# Patient Record
Sex: Female | Born: 1955
Health system: Southern US, Community
[De-identification: ages and names within clinical notes are randomized; demographics above are authoritative.]

## PROBLEM LIST (undated history)

## (undated) DIAGNOSIS — E119 Type 2 diabetes mellitus without complications: Secondary | ICD-10-CM

## (undated) DIAGNOSIS — B019 Varicella without complication: Secondary | ICD-10-CM

## (undated) DIAGNOSIS — I10 Essential (primary) hypertension: Secondary | ICD-10-CM

## (undated) HISTORY — DX: Varicella without complication: B01.9

## (undated) HISTORY — DX: Type 2 diabetes mellitus without complications: E11.9

## (undated) HISTORY — PX: DILATION AND CURETTAGE OF UTERUS: SHX78

---

## 2000-12-20 ENCOUNTER — Other Ambulatory Visit: Admission: RE | Admit: 2000-12-20 | Discharge: 2000-12-20 | Payer: Self-pay | Admitting: Obstetrics and Gynecology

## 2008-02-22 ENCOUNTER — Other Ambulatory Visit: Admission: RE | Admit: 2008-02-22 | Discharge: 2008-02-22 | Payer: Self-pay | Admitting: Obstetrics and Gynecology

## 2009-02-12 ENCOUNTER — Emergency Department (HOSPITAL_COMMUNITY): Admission: EM | Admit: 2009-02-12 | Discharge: 2009-02-13 | Payer: Self-pay | Admitting: Emergency Medicine

## 2009-03-25 ENCOUNTER — Other Ambulatory Visit: Admission: RE | Admit: 2009-03-25 | Discharge: 2009-03-25 | Payer: Self-pay | Admitting: Obstetrics and Gynecology

## 2009-04-15 ENCOUNTER — Encounter: Payer: Self-pay | Admitting: Obstetrics and Gynecology

## 2009-04-15 ENCOUNTER — Ambulatory Visit (HOSPITAL_COMMUNITY): Admission: RE | Admit: 2009-04-15 | Discharge: 2009-04-15 | Payer: Self-pay | Admitting: Obstetrics and Gynecology

## 2010-11-28 LAB — COMPREHENSIVE METABOLIC PANEL
ALT: 23 U/L (ref 0–35)
AST: 25 U/L (ref 0–37)
Albumin: 4 g/dL (ref 3.5–5.2)
Alkaline Phosphatase: 80 U/L (ref 39–117)
CO2: 27 mEq/L (ref 19–32)
Chloride: 105 mEq/L (ref 96–112)
Creatinine, Ser: 0.83 mg/dL (ref 0.4–1.2)
GFR calc Af Amer: >60 mL/min (ref >60)
GFR calc non Af Amer: >60 mL/min (ref >60)
Potassium: 4 mEq/L (ref 3.5–5.1)
Sodium: 140 mEq/L (ref 135–145)
Total Bilirubin: 0.7 mg/dL (ref 0.3–1.2)

## 2010-11-28 LAB — CBC
MCV: 87.8 fL (ref 78.0–100.0)
Platelets: 244 10*3/uL (ref 150–400)
RBC: 4.85 MIL/uL (ref 3.87–5.11)
WBC: 5.3 10*3/uL (ref 4.0–10.5)

## 2011-01-05 NOTE — Op Note (Signed)
NAMEDEVINE, Donna                ACCOUNT NO.:  [id number]   MEDICAL RECORD NO.:  [id number]          PATIENT TYPE:  AMB   LOCATION:  DAY                           FACILITY:  [facility]   PHYSICIAN:  Tilda Burrow, M.D. DATE OF BIRTH:  09/14/1955   DATE OF PROCEDURE:  04/15/2009  DATE OF DISCHARGE:  04/15/2009                               OPERATIVE REPORT   PREOPERATIVE DIAGNOSES:  Postmenopausal bleeding and suspected  endometrial polyp.   POSTOPERATIVE DIAGNOSES:  Postmenopausal bleeding and suspected  endometrial polyp confirmed.   PROCEDURE:  Hysteroscopy, excision of endometrial polyp, dilation and  curettage.   SURGEON:  Tilda Burrow, MD   ASSISTANT:  None.   ANESTHESIA:  General, Idacavage CRNA   COMPLICATIONS:  None.   FINDINGS:  Uterus sounds to 8 cm, small 1-cm polyp originating from the  left cornual area of endometrial cavity.   DETAILS OF PROCEDURE:  The patient was taken to the operating room,  prepped and draped for vaginal procedure.  Time-out conducted.  Cervix  was grasped with single-tooth tenaculum and uterus sounded to 7 cm, with  dilation of the endometrium, dilation of the endocervix to 25-French  allowing introduction of a rigid 30-degree hysteroscope without  difficulty, identifying a small 1-cm long, flat polyp originating from  the thin stalk in the left cornual area.  No other significant portion  of the tissue were identified.  Hysteroscopic instruments were used to  transect across the lower stalk.  This was quite firm and fibrotic and  needed to be trimmed in segments.  The bulk of the polyp was able to be  removed in 2-3 large portions, total size was approximately 1 cm x 1 cm  x 5 mm.  The stump was then trimmed down as close as possible to the  endometrial surface and smooth sharp curettage then performed obtaining  scanty amount of additional tissue.  Specimen was sent for histology.  The patient was allowed to go to recovery room in  good condition after  removal of all hysteroscopic instruments.      Tilda Burrow, M.D.  Electronically Signed     JVF/MEDQ  D:  04/15/2009  T:  04/16/2009  Job:  [id number]
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
## 2015-06-18 ENCOUNTER — Emergency Department (HOSPITAL_COMMUNITY)
Admission: EM | Admit: 2015-06-18 | Discharge: 2015-06-18 | Discharge status: Absent without leave | Payer: BLUE CROSS/BLUE SHIELD

## 2015-06-18 ENCOUNTER — Emergency Department (HOSPITAL_COMMUNITY): Payer: BLUE CROSS/BLUE SHIELD

## 2015-06-18 ENCOUNTER — Observation Stay (HOSPITAL_COMMUNITY)
Admission: EM | Admit: 2015-06-18 | Discharge: 2015-06-20 | Disposition: A | Discharge status: Home / Self Care | Payer: BLUE CROSS/BLUE SHIELD | Attending: General Surgery | Admitting: General Surgery

## 2015-06-18 ENCOUNTER — Encounter (HOSPITAL_COMMUNITY): Payer: Self-pay | Admitting: Emergency Medicine

## 2015-06-18 DIAGNOSIS — E669 Obesity, unspecified: Secondary | ICD-10-CM | POA: Insufficient documentation

## 2015-06-18 DIAGNOSIS — Z6838 Body mass index (BMI) 38.0-38.9, adult: Secondary | ICD-10-CM | POA: Diagnosis not present

## 2015-06-18 DIAGNOSIS — Z87891 Personal history of nicotine dependence: Secondary | ICD-10-CM | POA: Insufficient documentation

## 2015-06-18 DIAGNOSIS — K353 Acute appendicitis with localized peritonitis, without perforation or gangrene: Secondary | ICD-10-CM

## 2015-06-18 DIAGNOSIS — K3580 Unspecified acute appendicitis: Secondary | ICD-10-CM | POA: Diagnosis present

## 2015-06-18 DIAGNOSIS — R109 Unspecified abdominal pain: Secondary | ICD-10-CM | POA: Diagnosis present

## 2015-06-18 DIAGNOSIS — I10 Essential (primary) hypertension: Secondary | ICD-10-CM | POA: Insufficient documentation

## 2015-06-18 HISTORY — DX: Essential (primary) hypertension: I10

## 2015-06-18 LAB — COMPREHENSIVE METABOLIC PANEL
ALBUMIN: 4.3 g/dL (ref 3.5–5.0)
ALT: 29 U/L (ref 14–54)
AST: 27 U/L (ref 15–41)
Alkaline Phosphatase: 85 U/L (ref 38–126)
Anion gap: 9 (ref 5–15)
BUN: 12 mg/dL (ref 6–20)
CHLORIDE: 103 mmol/L (ref 101–111)
CO2: 27 mmol/L (ref 22–32)
CREATININE: 0.73 mg/dL (ref 0.44–1.00)
Calcium: 9.3 mg/dL (ref 8.9–10.3)
GFR calc Af Amer: >60 mL/min (ref >60)
GLUCOSE: 143 mg/dL — AB (ref 65–99)
POTASSIUM: 3.9 mmol/L (ref 3.5–5.1)
Sodium: 139 mmol/L (ref 135–145)
Total Bilirubin: 0.9 mg/dL (ref 0.3–1.2)
Total Protein: 7.6 g/dL (ref 6.5–8.1)

## 2015-06-18 LAB — URINALYSIS, ROUTINE W REFLEX MICROSCOPIC
BILIRUBIN URINE: NEGATIVE
GLUCOSE, UA: NEGATIVE mg/dL
HGB URINE DIPSTICK: NEGATIVE
KETONES UR: 15 mg/dL — AB
Leukocytes, UA: NEGATIVE
NITRITE: NEGATIVE
PH: 5.5 (ref 5.0–8.0)
Protein, ur: NEGATIVE mg/dL
SPECIFIC GRAVITY, URINE: 1.02 (ref 1.005–1.030)
Urobilinogen, UA: 0.2 mg/dL (ref 0.0–1.0)

## 2015-06-18 LAB — CBC
HEMATOCRIT: 43.2 % (ref 36.0–46.0)
Hemoglobin: 15.1 g/dL — ABNORMAL HIGH (ref 12.0–15.0)
MCH: 30.8 pg (ref 26.0–34.0)
MCHC: 35 g/dL (ref 30.0–36.0)
MCV: 88 fL (ref 78.0–100.0)
PLATELETS: 263 10*3/uL (ref 150–400)
RBC: 4.91 MIL/uL (ref 3.87–5.11)
RDW: 12.3 % (ref 11.5–15.5)
WBC: 15.7 10*3/uL — AB (ref 4.0–10.5)

## 2015-06-18 LAB — I-STAT TROPONIN, ED: Troponin i, poc: 0 ng/mL (ref 0.00–0.08)

## 2015-06-18 LAB — LIPASE, BLOOD: LIPASE: 22 U/L (ref 11–51)

## 2015-06-18 MED ORDER — ONDANSETRON HCL 4 MG/2ML IJ SOLN
4.0000 mg | Freq: Four times a day (QID) | INTRAMUSCULAR | Status: DC | PRN
  Administered 2015-06-19: 4 mg via INTRAVENOUS
  Filled 2015-06-18: qty 2

## 2015-06-18 MED ORDER — IOHEXOL 300 MG/ML  SOLN
25.0000 mL | Freq: Once | INTRAMUSCULAR | Status: AC | PRN
  Administered 2015-06-18: 25 mL via ORAL

## 2015-06-18 MED ORDER — METRONIDAZOLE IN NACL 5-0.79 MG/ML-% IV SOLN
500.0000 mg | Freq: Three times a day (TID) | INTRAVENOUS | Status: DC
  Administered 2015-06-19 – 2015-06-20 (×5): 500 mg via INTRAVENOUS
  Filled 2015-06-18 (×6): qty 100

## 2015-06-18 MED ORDER — IOHEXOL 300 MG/ML  SOLN
100.0000 mL | Freq: Once | INTRAMUSCULAR | Status: AC | PRN
  Administered 2015-06-18: 100 mL via INTRAVENOUS

## 2015-06-18 MED ORDER — DEXTROSE 5 % IV SOLN
2.0000 g | INTRAVENOUS | Status: DC
  Administered 2015-06-19 – 2015-06-20 (×2): 2 g via INTRAVENOUS
  Filled 2015-06-18 (×2): qty 2

## 2015-06-18 MED ORDER — HYDROMORPHONE HCL 1 MG/ML IJ SOLN
1.0000 mg | INTRAMUSCULAR | Status: DC | PRN
  Administered 2015-06-19: 1 mg via INTRAVENOUS
  Filled 2015-06-18 (×2): qty 1

## 2015-06-18 MED ORDER — ONDANSETRON 4 MG PO TBDP: 4.0000 mg | ORAL_TABLET | Freq: Four times a day (QID) | ORAL | Status: DC | PRN

## 2015-06-18 MED ORDER — HYDROMORPHONE HCL 1 MG/ML IJ SOLN
1.0000 mg | Freq: Once | INTRAMUSCULAR | Status: AC
Start: 2015-06-18 — End: 2015-06-18
  Administered 2015-06-18: 1 mg via INTRAVENOUS
  Filled 2015-06-18: qty 1

## 2015-06-18 MED ORDER — ZOLPIDEM TARTRATE 5 MG PO TABS: 5.0000 mg | ORAL_TABLET | Freq: Every evening | ORAL | Status: DC | PRN

## 2015-06-18 MED ORDER — ENOXAPARIN SODIUM 40 MG/0.4ML ~~LOC~~ SOLN
40.0000 mg | Freq: Every day | SUBCUTANEOUS | Status: DC
  Administered 2015-06-19: 40 mg via SUBCUTANEOUS
  Filled 2015-06-18 (×2): qty 0.4

## 2015-06-18 MED ORDER — SODIUM CHLORIDE 0.9 % IV BOLUS (SEPSIS)
1000.0000 mL | Freq: Once | INTRAVENOUS | Status: AC
  Administered 2015-06-18: 1000 mL via INTRAVENOUS

## 2015-06-18 MED ORDER — KCL IN DEXTROSE-NACL 20-5-0.9 MEQ/L-%-% IV SOLN
INTRAVENOUS | Status: DC
  Administered 2015-06-19 (×3): via INTRAVENOUS
  Filled 2015-06-18 (×5): qty 1000

## 2015-06-18 NOTE — ED Provider Notes (Signed)
CSN: [id number]     Arrival date & time 06/18/15  1645 History   First MD Initiated Contact with Patient 06/18/15 1929     Chief Complaint  Patient presents with  . Abdominal Pain  . Nausea    (Consider location/radiation/quality/duration/timing/severity/associated sxs/prior Treatment) HPI   Donna Russell is a 59 yo Caucasian female with history of hypertension who presents to the Emergency Department for evaluation of abdominal pain that started this morning. She states pain started in the lower umbilical region of her abdomen and radiates to her chest and right lower quadrant. She describes pain as sharp and tight. She reports pain is intermittent. Endorses chills. Denies fever, shortness of breath, diaphoresis, dizziness, nausea/vomiting, hematuria, headache. Currently rates pain as 10/10. Pt states this has never happened to her before. Reports pain is worse with movement and improves with lying down.   Past Medical History  Diagnosis Date  . Hypertension    Past Surgical History  Procedure Laterality Date  . Dilation and curettage of uterus     History reviewed. No pertinent family history. Social History  Substance Use Topics  . Smoking status: None  . Smokeless tobacco: None  . Alcohol Use: None   OB History    No data available     Review of Systems  All other systems negative except as documented in the HPI. All pertinent positives and negatives as reviewed in the HPI.  Allergies  Review of patient's allergies indicates no known allergies.  Home Medications   Prior to Admission medications   Medication Sig Start Date End Date Taking? Authorizing Provider  Coenzyme Q10 (CO Q 10 PO) Take 1 tablet by mouth daily.   Yes Historical Provider, MD  lisinopril (PRINIVIL,ZESTRIL) 20 MG tablet Take 20 mg by mouth daily.   Yes Historical Provider, MD  Multiple Vitamin (MULTIVITAMIN WITH MINERALS) TABS tablet Take 1 tablet by mouth daily.   Yes Historical Provider, MD    BP 152/97 mmHg  Pulse 87  Temp(Src) 98.5 F (36.9 C) (Oral)  Resp 22  SpO2 98% Physical Exam  Constitutional: She is oriented to person, place, and time.  59 yo female lying in bed, appears to be in pain.   HENT:  Head: Normocephalic and atraumatic.  Mouth/Throat: Oropharynx is clear and moist.  Eyes: Pupils are equal, round, and reactive to light.  Neck: Normal range of motion. Neck supple.  Cardiovascular: Normal rate and regular rhythm.  Exam reveals no gallop and no friction rub.   No murmur heard. Pulmonary/Chest: Effort normal and breath sounds normal. No respiratory distress. She has no wheezes. She has no rales.  Abdominal: Soft. Bowel sounds are normal. She exhibits no distension and no mass. There is tenderness. There is guarding. There is no rebound.  Musculoskeletal: Normal range of motion.  No CVA tenderness.  Neurological: She is alert and oriented to person, place, and time.  Skin: Skin is warm. No rash noted. No erythema. No pallor.    ED Course  Procedures (including critical care time) Labs Review Labs Reviewed  COMPREHENSIVE METABOLIC PANEL - Abnormal; Notable for the following:    Glucose, Bld 143 (*)    All other components within normal limits  CBC - Abnormal; Notable for the following:    WBC 15.7 (*)    Hemoglobin 15.1 (*)    All other components within normal limits  URINALYSIS, ROUTINE W REFLEX MICROSCOPIC (NOT AT [facility]) - Abnormal; Notable for the following:    APPearance CLOUDY (*)  Ketones, ur 15 (*)    All other components within normal limits  LIPASE, BLOOD  I-STAT TROPOININ, ED    Imaging Review No results found. I have personally reviewed and evaluated these images and lab results as part of my medical decision-making.   EKG Interpretation   Date/Time:  Wednesday June 18 2015 17:11:05 EDT Ventricular Rate:  77 PR Interval:  147 QRS Duration: 105 QT Interval:  374 QTC Calculation: 423 R Axis:   8 Text Interpretation:   Sinus rhythm Low voltage, precordial leads RSR' in  V1 or V2, right VCD or RVH Baseline wander in lead(s) V3 ED PHYSICIAN  INTERPRETATION AVAILABLE IN [facility] Confirmed by TEST, Record  ([id number]) on 06/19/2015 9:27:35 AM      MDM   Final diagnoses:  None     10:50 pm - Pt states pain has subsided a little. However, CT reveals acute appendicitis.   Spoke with General Surgery about the patient and they will be down to evaluate her for surgery  Charlestine NightChristopher Aneya Daddona, PA-C 06/25/15 1608  Lavera Guiseana Duo Liu, MD 06/27/15 1215

## 2015-06-18 NOTE — Progress Notes (Signed)
Patient listed as having [employer] insurance without a pcp.  EDCM spoke to patient at bedside.  Patient reports her pcp is located at [facility].  Dr. Juluis RainierElizabeth Barnes.  System updated.

## 2015-06-18 NOTE — H&P (Signed)
Donna Russell is an 59 y.o. female.   Chief Complaint: Abdominal pain with diarrhea HPI: Patient presents to the emergency room with a one-day history of nausea vomiting diarrhea and right lower quadrant abdominal pain. Her symptoms started last night after dinner. She had diarrhea after eating last night. Today she developed more right lower quadrant pain which became constant and sharp. Made worse with movement. It has progressed throughout the day. Is a 7 out of 10. Location right lower quadrant abdomen. Sharp moves or coughs. CT scan showed acute appendicitis.  Past Medical History  Diagnosis Date  . Hypertension     Past Surgical History  Procedure Laterality Date  . Dilation and curettage of uterus      History reviewed. No pertinent family history. Social History:  has no tobacco, alcohol, and drug history on file.  Allergies: No Known Allergies   (Not in a hospital admission)  Results for orders placed or performed during the hospital encounter of 06/18/15 (from the past 48 hour(s))  Lipase, blood     Status: None   Collection Time: 06/18/15  5:41 PM  Result Value Ref Range   Lipase 22 11 - 51 U/L    Comment: Please note change in reference range.  Comprehensive metabolic panel     Status: Abnormal   Collection Time: 06/18/15  5:41 PM  Result Value Ref Range   Sodium 139 135 - 145 mmol/L   Potassium 3.9 3.5 - 5.1 mmol/L   Chloride 103 101 - 111 mmol/L   CO2 27 22 - 32 mmol/L   Glucose, Bld 143 (H) 65 - 99 mg/dL   BUN 12 6 - 20 mg/dL   Creatinine, Ser 0.73 0.44 - 1.00 mg/dL   Calcium 9.3 8.9 - 10.3 mg/dL   Total Protein 7.6 6.5 - 8.1 g/dL   Albumin 4.3 3.5 - 5.0 g/dL   AST 27 15 - 41 U/L   ALT 29 14 - 54 U/L   Alkaline Phosphatase 85 38 - 126 U/L   Total Bilirubin 0.9 0.3 - 1.2 mg/dL   GFR calc non Af Amer >60 >60 mL/min   GFR calc Af Amer >60 >60 mL/min    Comment: (NOTE) The eGFR has been calculated using the CKD EPI equation. This calculation has not been  validated in all clinical situations. eGFR's persistently <60 mL/min signify possible Chronic Kidney Disease.    Anion gap 9 5 - 15  CBC     Status: Abnormal   Collection Time: 06/18/15  5:41 PM  Result Value Ref Range   WBC 15.7 (H) 4.0 - 10.5 K/uL   RBC 4.91 3.87 - 5.11 MIL/uL   Hemoglobin 15.1 (H) 12.0 - 15.0 g/dL   HCT 43.2 36.0 - 46.0 %   MCV 88.0 78.0 - 100.0 fL   MCH 30.8 26.0 - 34.0 pg   MCHC 35.0 30.0 - 36.0 g/dL   RDW 12.3 11.5 - 15.5 %   Platelets 263 150 - 400 K/uL  I-stat troponin, ED     Status: None   Collection Time: 06/18/15  5:46 PM  Result Value Ref Range   Troponin i, poc 0.00 0.00 - 0.08 ng/mL   Comment 3            Comment: Due to the release kinetics of cTnI, a negative result within the first hours of the onset of symptoms does not rule out myocardial infarction with certainty. If myocardial infarction is still suspected, repeat the test at  appropriate intervals.   Urinalysis, Routine w reflex microscopic (not at [facility])     Status: Abnormal   Collection Time: 06/18/15  7:31 PM  Result Value Ref Range   Color, Urine YELLOW YELLOW   APPearance CLOUDY (A) CLEAR   Specific Gravity, Urine 1.020 1.005 - 1.030   pH 5.5 5.0 - 8.0   Glucose, UA NEGATIVE NEGATIVE mg/dL   Hgb urine dipstick NEGATIVE NEGATIVE   Bilirubin Urine NEGATIVE NEGATIVE   Ketones, ur 15 (A) NEGATIVE mg/dL   Protein, ur NEGATIVE NEGATIVE mg/dL   Urobilinogen, UA 0.2 0.0 - 1.0 mg/dL   Nitrite NEGATIVE NEGATIVE   Leukocytes, UA NEGATIVE NEGATIVE    Comment: MICROSCOPIC NOT DONE ON URINES WITH NEGATIVE PROTEIN, BLOOD, LEUKOCYTES, NITRITE, OR GLUCOSE <1000 mg/dL.   Ct Abdomen Pelvis W Contrast  06/18/2015  CLINICAL DATA:  Acute onset of diarrhea. Chills and nausea. Stomach ache. Right lower quadrant abdominal pain. Initial encounter. EXAM: CT ABDOMEN AND PELVIS WITH CONTRAST TECHNIQUE: Multidetector CT imaging of the abdomen and pelvis was performed using the standard protocol following  bolus administration of intravenous contrast. CONTRAST:  154m OMNIPAQUE IOHEXOL 300 MG/ML  SOLN COMPARISON:  None. FINDINGS: Minimal bibasilar atelectasis is noted. The liver and spleen are unremarkable in appearance. The gallbladder is within normal limits. The pancreas and adrenal glands are unremarkable. The kidneys are unremarkable in appearance. There is no evidence of hydronephrosis. No renal or ureteral stones are seen. No perinephric stranding is appreciated. No free fluid is identified. The small bowel is unremarkable in appearance. The stomach is within normal limits. No acute vascular abnormalities are seen. The appendix is dilated to 1.4 cm in maximal diameter, with surrounding soft tissue inflammation and trace fluid. Findings are compatible with acute appendicitis. There is no evidence of perforation or abscess formation. Scattered diverticulosis is noted along the proximal sigmoid colon, without evidence of diverticulitis. The bladder is mildly distended and grossly unremarkable. The uterus is unremarkable in appearance. The ovaries are relatively symmetric. No suspicious adnexal masses are seen. No inguinal lymphadenopathy is seen. No acute osseous abnormalities are identified. IMPRESSION: 1. Acute appendicitis, with dilatation of the appendix to 1.4 cm in maximal diameter, with surrounding soft tissue inflammation and trace fluid. No evidence of perforation or abscess formation. 2. Scattered diverticulosis along the proximal sigmoid colon, without evidence of diverticulitis. These results were called by telephone at the time of interpretation on 06/18/2015 at 10:47 pm to Dr. BEvelina Bucy who verbally acknowledged these results. Electronically Signed   By: JGarald BaldingM.D.   On: 06/18/2015 22:47    Review of Systems  Constitutional: Positive for fever and chills.  HENT: Negative.   Respiratory: Negative.   Cardiovascular: Negative.   Gastrointestinal: Positive for abdominal pain and  diarrhea.  Genitourinary: Negative.   Musculoskeletal: Negative.   Skin: Negative.     Blood pressure 139/69, pulse 97, temperature 98.5 F (36.9 C), temperature source Oral, resp. rate 14, SpO2 96 %. Physical Exam  Constitutional: She is oriented to person, place, and time. She appears well-developed and well-nourished.  HENT:  Head: Normocephalic and atraumatic.  Eyes: Pupils are equal, round, and reactive to light. No scleral icterus.  Neck: Normal range of motion.  Cardiovascular: Normal rate and regular rhythm.   Respiratory: Effort normal and breath sounds normal.  GI: Soft. There is tenderness in the right lower quadrant. There is rebound, guarding and tenderness at McBurney's point. There is no rigidity.  Musculoskeletal: Normal range of motion.  Neurological:  She is alert and oriented to person, place, and time.  Skin: Skin is warm and dry.  Psychiatric: She has a normal mood and affect. Her behavior is normal.     Assessment/Plan Acute appendicitis nonruptured  Admit for IV fluids, IV antibiotics and surgery in a.m. per Dr. the week. Nothing by mouth after midnight. Plan discussed with patient and family.  Cline Draheim A. 06/18/2015, 11:57 PM

## 2015-06-18 NOTE — ED Provider Notes (Signed)
Medical screening examination/treatment/procedure(s) were conducted as a shared visit with non-physician practitioner(s) and myself.  I personally evaluated the patient during the encounter.  59 year old female with history of hypertension and no prior abdominal surgeries who presents with abdominal pain and nausea. Reports onset of generalized abdominal pain this morning, that is now traveled to the right lower quadrant. With nausea and diarrhea, but no vomiting.  Afebrile here and hemodynamically stable. Abdomen is soft and non-peritoneal focal right lower quadrant tenderness to palpation. Leukocytosis present on blood work. CT abdomen pelvis visualized and showed acute uncomplicated appendicitis. General surgeries consulted. We will initiate antibiotics, IV fluids, and admit for ongoing management.  Lavera Guiseana Duo Ahniya Mitchum, MD 06/18/15 2329

## 2015-06-18 NOTE — ED Notes (Signed)
Pt from Urgent care today, referred here for possible appendicitis. Pt states she went out to eat last night and had an urgent episode of diarrhea. Went to sleep, had another soft BM this morning with a stomach ache, chills, and occasional nausea. States the stomach pain feels like a pressure in the center of her stomach with a sharp pain in the lower right quadrant. No vomiting/fever/SOB. States the pain occasionally radiates up into chest, and that the pain in her RLQ is so severe it makes it difficult to walk.

## 2015-06-19 ENCOUNTER — Observation Stay (HOSPITAL_COMMUNITY): Payer: BLUE CROSS/BLUE SHIELD | Admitting: Certified Registered Nurse Anesthetist

## 2015-06-19 ENCOUNTER — Encounter (HOSPITAL_COMMUNITY): Payer: Self-pay | Admitting: *Deleted

## 2015-06-19 ENCOUNTER — Encounter (HOSPITAL_COMMUNITY)
Admission: EM | Disposition: A | Discharge status: Home / Self Care | Payer: Self-pay | Source: Home / Self Care | Attending: Emergency Medicine

## 2015-06-19 HISTORY — PX: LAPAROSCOPIC APPENDECTOMY: SHX408

## 2015-06-19 LAB — URINE MICROSCOPIC-ADD ON

## 2015-06-19 LAB — CBC
HEMATOCRIT: 40.8 % (ref 36.0–46.0)
Hemoglobin: 13.6 g/dL (ref 12.0–15.0)
MCH: 29.7 pg (ref 26.0–34.0)
MCHC: 33.3 g/dL (ref 30.0–36.0)
MCV: 89.1 fL (ref 78.0–100.0)
PLATELETS: 203 10*3/uL (ref 150–400)
RBC: 4.58 MIL/uL (ref 3.87–5.11)
RDW: 12.8 % (ref 11.5–15.5)
WBC: 15 10*3/uL — ABNORMAL HIGH (ref 4.0–10.5)

## 2015-06-19 LAB — URINALYSIS, ROUTINE W REFLEX MICROSCOPIC
BILIRUBIN URINE: NEGATIVE
Glucose, UA: NEGATIVE mg/dL
HGB URINE DIPSTICK: NEGATIVE
Ketones, ur: NEGATIVE mg/dL
Nitrite: POSITIVE — AB
PH: 5 (ref 5.0–8.0)
Protein, ur: NEGATIVE mg/dL
SPECIFIC GRAVITY, URINE: 1.042 — AB (ref 1.005–1.030)
Urobilinogen, UA: 0.2 mg/dL (ref 0.0–1.0)

## 2015-06-19 LAB — GLUCOSE, CAPILLARY
GLUCOSE-CAPILLARY: 130 mg/dL — AB (ref 65–99)
Glucose-Capillary: 180 mg/dL — ABNORMAL HIGH (ref 65–99)

## 2015-06-19 SURGERY — APPENDECTOMY, LAPAROSCOPIC
Anesthesia: General

## 2015-06-19 MED ORDER — EPHEDRINE SULFATE 50 MG/ML IJ SOLN
INTRAMUSCULAR | Status: AC
  Filled 2015-06-19: qty 1

## 2015-06-19 MED ORDER — MIDAZOLAM HCL 5 MG/5ML IJ SOLN
INTRAMUSCULAR | Status: DC | PRN
  Administered 2015-06-19 (×2): 1 mg via INTRAVENOUS

## 2015-06-19 MED ORDER — GLYCOPYRROLATE 0.2 MG/ML IJ SOLN
INTRAMUSCULAR | Status: AC
  Filled 2015-06-19: qty 3

## 2015-06-19 MED ORDER — LIDOCAINE HCL (CARDIAC) 20 MG/ML IV SOLN
INTRAVENOUS | Status: DC | PRN
  Administered 2015-06-19: 100 mg via INTRAVENOUS

## 2015-06-19 MED ORDER — BUPIVACAINE-EPINEPHRINE (PF) 0.25% -1:200000 IJ SOLN
INTRAMUSCULAR | Status: AC
  Filled 2015-06-19: qty 30

## 2015-06-19 MED ORDER — GLYCOPYRROLATE 0.2 MG/ML IJ SOLN
INTRAMUSCULAR | Status: DC | PRN
  Administered 2015-06-19: 0.6 mg via INTRAVENOUS
  Administered 2015-06-19: 0.2 mg via INTRAVENOUS

## 2015-06-19 MED ORDER — LIDOCAINE HCL (CARDIAC) 20 MG/ML IV SOLN
INTRAVENOUS | Status: AC
  Filled 2015-06-19: qty 5

## 2015-06-19 MED ORDER — PHENYLEPHRINE 40 MCG/ML (10ML) SYRINGE FOR IV PUSH (FOR BLOOD PRESSURE SUPPORT)
PREFILLED_SYRINGE | INTRAVENOUS | Status: AC
  Filled 2015-06-19: qty 10

## 2015-06-19 MED ORDER — ENOXAPARIN SODIUM 40 MG/0.4ML ~~LOC~~ SOLN
40.0000 mg | Freq: Every day | SUBCUTANEOUS | Status: DC
  Filled 2015-06-19: qty 0.4

## 2015-06-19 MED ORDER — SUCCINYLCHOLINE CHLORIDE 20 MG/ML IJ SOLN
INTRAMUSCULAR | Status: DC | PRN
  Administered 2015-06-19: 100 mg via INTRAVENOUS

## 2015-06-19 MED ORDER — DEXAMETHASONE SODIUM PHOSPHATE 10 MG/ML IJ SOLN
INTRAMUSCULAR | Status: AC
  Filled 2015-06-19: qty 1

## 2015-06-19 MED ORDER — MIDAZOLAM HCL 2 MG/2ML IJ SOLN
INTRAMUSCULAR | Status: AC
  Filled 2015-06-19: qty 4

## 2015-06-19 MED ORDER — ONDANSETRON HCL 4 MG/2ML IJ SOLN
INTRAMUSCULAR | Status: DC | PRN
  Administered 2015-06-19: 4 mg via INTRAVENOUS

## 2015-06-19 MED ORDER — SODIUM CHLORIDE 0.9 % IV SOLN
10.0000 mg | INTRAVENOUS | Status: DC | PRN
  Administered 2015-06-19: 50 ug/min via INTRAVENOUS

## 2015-06-19 MED ORDER — PHENYLEPHRINE HCL 10 MG/ML IJ SOLN
INTRAMUSCULAR | Status: DC | PRN
  Administered 2015-06-19 (×5): 80 ug via INTRAVENOUS

## 2015-06-19 MED ORDER — FENTANYL CITRATE (PF) 100 MCG/2ML IJ SOLN
INTRAMUSCULAR | Status: DC | PRN
  Administered 2015-06-19 (×2): 25 ug via INTRAVENOUS
  Administered 2015-06-19: 50 ug via INTRAVENOUS

## 2015-06-19 MED ORDER — LACTATED RINGERS IR SOLN
Status: DC | PRN
  Administered 2015-06-19: 1

## 2015-06-19 MED ORDER — 0.9 % SODIUM CHLORIDE (POUR BTL) OPTIME
TOPICAL | Status: DC | PRN
  Administered 2015-06-19: 1000 mL

## 2015-06-19 MED ORDER — SODIUM CHLORIDE 0.9 % IJ SOLN
INTRAMUSCULAR | Status: AC
  Filled 2015-06-19: qty 10

## 2015-06-19 MED ORDER — LACTATED RINGERS IV SOLN
INTRAVENOUS | Status: DC | PRN
  Administered 2015-06-19 (×2): via INTRAVENOUS

## 2015-06-19 MED ORDER — ONDANSETRON HCL 4 MG/2ML IJ SOLN
INTRAMUSCULAR | Status: AC
  Filled 2015-06-19: qty 2

## 2015-06-19 MED ORDER — ROCURONIUM BROMIDE 100 MG/10ML IV SOLN
INTRAVENOUS | Status: AC
  Filled 2015-06-19: qty 1

## 2015-06-19 MED ORDER — EPHEDRINE SULFATE 50 MG/ML IJ SOLN
INTRAMUSCULAR | Status: DC | PRN
  Administered 2015-06-19: 10 mg via INTRAVENOUS

## 2015-06-19 MED ORDER — METRONIDAZOLE IN NACL 5-0.79 MG/ML-% IV SOLN
INTRAVENOUS | Status: AC
Start: 2015-06-19 — End: 2015-06-19
  Filled 2015-06-19: qty 100

## 2015-06-19 MED ORDER — PROMETHAZINE HCL 25 MG/ML IJ SOLN: 6.2500 mg | INTRAMUSCULAR | Status: DC | PRN

## 2015-06-19 MED ORDER — BUPIVACAINE-EPINEPHRINE 0.25% -1:200000 IJ SOLN
INTRAMUSCULAR | Status: DC | PRN
  Administered 2015-06-19: 23 mL

## 2015-06-19 MED ORDER — HYDROMORPHONE HCL 1 MG/ML IJ SOLN: 0.2500 mg | INTRAMUSCULAR | Status: DC | PRN

## 2015-06-19 MED ORDER — DEXAMETHASONE SODIUM PHOSPHATE 10 MG/ML IJ SOLN
INTRAMUSCULAR | Status: DC | PRN
  Administered 2015-06-19: 10 mg via INTRAVENOUS

## 2015-06-19 MED ORDER — PHENYLEPHRINE HCL 10 MG/ML IJ SOLN
INTRAMUSCULAR | Status: AC
  Filled 2015-06-19: qty 1

## 2015-06-19 MED ORDER — ROCURONIUM BROMIDE 100 MG/10ML IV SOLN
INTRAVENOUS | Status: DC | PRN
  Administered 2015-06-19: 30 mg via INTRAVENOUS

## 2015-06-19 MED ORDER — PROPOFOL 10 MG/ML IV BOLUS
INTRAVENOUS | Status: AC
  Filled 2015-06-19: qty 20

## 2015-06-19 MED ORDER — NEOSTIGMINE METHYLSULFATE 10 MG/10ML IV SOLN
INTRAVENOUS | Status: DC | PRN
  Administered 2015-06-19: 5 mg via INTRAVENOUS

## 2015-06-19 MED ORDER — PROPOFOL 10 MG/ML IV BOLUS
INTRAVENOUS | Status: DC | PRN
  Administered 2015-06-19: 130 mg via INTRAVENOUS

## 2015-06-19 MED ORDER — FENTANYL CITRATE (PF) 250 MCG/5ML IJ SOLN
INTRAMUSCULAR | Status: AC
  Filled 2015-06-19: qty 25

## 2015-06-19 SURGICAL SUPPLY — 37 items
APPLIER CLIP ROT 10 11.4 M/L (STAPLE)
CLIP APPLIE ROT 10 11.4 M/L (STAPLE) IMPLANT
COVER SURGICAL LIGHT HANDLE (MISCELLANEOUS) ×2 IMPLANT
CUTTER FLEX LINEAR 45M (STAPLE) ×2 IMPLANT
DECANTER SPIKE VIAL GLASS SM (MISCELLANEOUS) ×2 IMPLANT
DERMABOND ADVANCED (GAUZE/BANDAGES/DRESSINGS) ×1
DERMABOND ADVANCED .7 DNX12 (GAUZE/BANDAGES/DRESSINGS) ×1 IMPLANT
DRAPE LAPAROSCOPIC ABDOMINAL (DRAPES) ×2 IMPLANT
DRAPE UTILITY XL STRL (DRAPES) ×2 IMPLANT
ELECT PENCIL ROCKER SW 15FT (MISCELLANEOUS) ×2 IMPLANT
ELECT REM PT RETURN 9FT ADLT (ELECTROSURGICAL) ×2
ELECTRODE REM PT RTRN 9FT ADLT (ELECTROSURGICAL) ×1 IMPLANT
ENDOLOOP SUT PDS II  0 18 (SUTURE)
ENDOLOOP SUT PDS II 0 18 (SUTURE) IMPLANT
GLOVE BIO SURGEON STRL SZ7.5 (GLOVE) ×2 IMPLANT
GOWN STRL REUS W/ TWL XL LVL3 (GOWN DISPOSABLE) ×1 IMPLANT
GOWN STRL REUS W/TWL XL LVL3 (GOWN DISPOSABLE) ×5 IMPLANT
IV LACTATED RINGERS 1000ML (IV SOLUTION) ×2 IMPLANT
KIT BASIN OR (CUSTOM PROCEDURE TRAY) ×2 IMPLANT
LIQUID BAND (GAUZE/BANDAGES/DRESSINGS) ×2 IMPLANT
NS IRRIG 1000ML POUR BTL (IV SOLUTION) ×2 IMPLANT
POUCH SPECIMEN RETRIEVAL 10MM (ENDOMECHANICALS) ×4 IMPLANT
RELOAD 45 VASCULAR/THIN (ENDOMECHANICALS) IMPLANT
RELOAD STAPLE TA45 3.5 REG BLU (ENDOMECHANICALS) ×2 IMPLANT
SCISSORS LAP 5X35 DISP (ENDOMECHANICALS) ×2 IMPLANT
SET IRRIG TUBING LAPAROSCOPIC (IRRIGATION / IRRIGATOR) ×2 IMPLANT
SHEARS HARMONIC ACE PLUS 36CM (ENDOMECHANICALS) IMPLANT
SOLUTION ANTI FOG 6CC (MISCELLANEOUS) ×2 IMPLANT
SUT MNCRL AB 4-0 PS2 18 (SUTURE) ×2 IMPLANT
TOWEL OR 17X26 10 PK STRL BLUE (TOWEL DISPOSABLE) ×2 IMPLANT
TRAY FOLEY W/METER SILVER 14FR (SET/KITS/TRAYS/PACK) ×2 IMPLANT
TRAY FOLEY W/METER SILVER 16FR (SET/KITS/TRAYS/PACK) ×2 IMPLANT
TRAY LAPAROSCOPIC (CUSTOM PROCEDURE TRAY) ×2 IMPLANT
TROCAR BLADELESS OPT 5 75 (ENDOMECHANICALS) ×2 IMPLANT
TROCAR SLEEVE XCEL 5X75 (ENDOMECHANICALS) ×2 IMPLANT
TROCAR XCEL BLUNT TIP 100MML (ENDOMECHANICALS) ×2 IMPLANT
TUBING INSUFFLATION 10FT LAP (TUBING) ×2 IMPLANT

## 2015-06-19 NOTE — ED Notes (Signed)
Attempted to call report, RN will call back.

## 2015-06-19 NOTE — Progress Notes (Signed)
Patient ID: Donna Russell, female   DOB: [birth year], 59 y.o.   MRN: [id number]     [facility]      [address] [place], [place] [address]    Phone: [phone] FAX: [phone]     Subjective: Sore.  Vomiting.  No sob, cp, palpitations.   Objective:  Vital signs:  Filed Vitals:   06/18/15 2236 06/19/15 0100 06/19/15 0107 06/19/15 0444  BP: 139/69  123/62 119/62  Pulse: 97  91 92  Temp:   99.6 F (37.6 C) 100.3 F (37.9 C)  TempSrc:   Oral Oral  Resp: _0 Height:  _1  (1.676 m)    Weight:  107.956 kg (238 lb)    SpO2: 96%  87% 90%    Last BM Date: 06/18/15  Intake/Output   Yesterday:  10/26 0701 - 10/27 0700 In: 2200 [I.V.:1200; IV Piggyback:1000] Out: -  This shift:    I/O last 3 completed shifts: In: 2200 [I.V.:1200; IV Piggyback:1000] Out: -       Physical Exam: General: Pt awake/alert/oriented x4 in no acute distress Chest: cta.  No chest wall pain w good excursion CV:  Pulses intact.  Regular rhythm MS: Normal AROM mjr joints.  No obvious deformity Abdomen: Soft.  Nondistended.  ttp rlq.   No evidence of peritonitis.  No incarcerated hernias. Ext:  SCDs BLE.  No mjr edema.  No cyanosis Skin: No petechiae / purpura   Problem List:   Active Problems:   Acute appendicitis    Results:   Labs: Results for orders placed or performed during the hospital encounter of 06/18/15 (from the past 48 hour(s))  Lipase, blood     Status: None   Collection Time: 06/18/15  5:41 PM  Result Value Ref Range   Lipase 22 11 - 51 U/L    Comment: Please note change in reference range.  Comprehensive metabolic panel     Status: Abnormal   Collection Time: 06/18/15  5:41 PM  Result Value Ref Range   Sodium 139 135 - 145 mmol/L   Potassium 3.9 3.5 - 5.1 mmol/L   Chloride 103 101 - 111 mmol/L   CO2 27 22 - 32 mmol/L   Glucose, Bld 143 (H) 65 - 99 mg/dL   BUN 12 6 - 20 mg/dL   Creatinine, Ser 0.73 0.44  - 1.00 mg/dL   Calcium 9.3 8.9 - 10.3 mg/dL   Total Protein 7.6 6.5 - 8.1 g/dL   Albumin 4.3 3.5 - 5.0 g/dL   AST 27 15 - 41 U/L   ALT 29 14 - 54 U/L   Alkaline Phosphatase 85 38 - 126 U/L   Total Bilirubin 0.9 0.3 - 1.2 mg/dL   GFR calc non Af Amer >60 >60 mL/min   GFR calc Af Amer >60 >60 mL/min    Comment: (NOTE) The eGFR has been calculated using the CKD EPI equation. This calculation has not been validated in all clinical situations. eGFR's persistently <60 mL/min signify possible Chronic Kidney Disease.    Anion gap 9 5 - 15  CBC     Status: Abnormal   Collection Time: 06/18/15  5:41 PM  Result Value Ref Range   WBC 15.7 (H) 4.0 - 10.5 K/uL   RBC 4.91 3.87 - 5.11 MIL/uL   Hemoglobin 15.1 (H) 12.0 - 15.0 g/dL   HCT 43.2 36.0 - 46.0 %   MCV 88.0 78.0 - 100.0 fL  MCH 30.8 26.0 - 34.0 pg   MCHC 35.0 30.0 - 36.0 g/dL   RDW 12.3 11.5 - 15.5 %   Platelets 263 150 - 400 K/uL  I-stat troponin, ED     Status: None   Collection Time: 06/18/15  5:46 PM  Result Value Ref Range   Troponin i, poc 0.00 0.00 - 0.08 ng/mL   Comment 3            Comment: Due to the release kinetics of cTnI, a negative result within the first hours of the onset of symptoms does not rule out myocardial infarction with certainty. If myocardial infarction is still suspected, repeat the test at appropriate intervals.   Urinalysis, Routine w reflex microscopic (not at [facility])     Status: Abnormal   Collection Time: 06/18/15  7:31 PM  Result Value Ref Range   Color, Urine YELLOW YELLOW   APPearance CLOUDY (A) CLEAR   Specific Gravity, Urine 1.020 1.005 - 1.030   pH 5.5 5.0 - 8.0   Glucose, UA NEGATIVE NEGATIVE mg/dL   Hgb urine dipstick NEGATIVE NEGATIVE   Bilirubin Urine NEGATIVE NEGATIVE   Ketones, ur 15 (A) NEGATIVE mg/dL   Protein, ur NEGATIVE NEGATIVE mg/dL   Urobilinogen, UA 0.2 0.0 - 1.0 mg/dL   Nitrite NEGATIVE NEGATIVE   Leukocytes, UA NEGATIVE NEGATIVE    Comment: MICROSCOPIC NOT DONE ON  URINES WITH NEGATIVE PROTEIN, BLOOD, LEUKOCYTES, NITRITE, OR GLUCOSE <1000 mg/dL.  Glucose, capillary     Status: Abnormal   Collection Time: 06/19/15  4:51 AM  Result Value Ref Range   Glucose-Capillary 180 (H) 65 - 99 mg/dL  CBC     Status: Abnormal   Collection Time: 06/19/15  6:00 AM  Result Value Ref Range   WBC 15.0 (H) 4.0 - 10.5 K/uL   RBC 4.58 3.87 - 5.11 MIL/uL   Hemoglobin 13.6 12.0 - 15.0 g/dL   HCT 40.8 36.0 - 46.0 %   MCV 89.1 78.0 - 100.0 fL   MCH 29.7 26.0 - 34.0 pg   MCHC 33.3 30.0 - 36.0 g/dL   RDW 12.8 11.5 - 15.5 %   Platelets 203 150 - 400 K/uL    Imaging / Studies: Ct Abdomen Pelvis W Contrast  06/18/2015  CLINICAL DATA:  Acute onset of diarrhea. Chills and nausea. Stomach ache. Right lower quadrant abdominal pain. Initial encounter. EXAM: CT ABDOMEN AND PELVIS WITH CONTRAST TECHNIQUE: Multidetector CT imaging of the abdomen and pelvis was performed using the standard protocol following bolus administration of intravenous contrast. CONTRAST:  177m OMNIPAQUE IOHEXOL 300 MG/ML  SOLN COMPARISON:  None. FINDINGS: Minimal bibasilar atelectasis is noted. The liver and spleen are unremarkable in appearance. The gallbladder is within normal limits. The pancreas and adrenal glands are unremarkable. The kidneys are unremarkable in appearance. There is no evidence of hydronephrosis. No renal or ureteral stones are seen. No perinephric stranding is appreciated. No free fluid is identified. The small bowel is unremarkable in appearance. The stomach is within normal limits. No acute vascular abnormalities are seen. The appendix is dilated to 1.4 cm in maximal diameter, with surrounding soft tissue inflammation and trace fluid. Findings are compatible with acute appendicitis. There is no evidence of perforation or abscess formation. Scattered diverticulosis is noted along the proximal sigmoid colon, without evidence of diverticulitis. The bladder is mildly distended and grossly  unremarkable. The uterus is unremarkable in appearance. The ovaries are relatively symmetric. No suspicious adnexal masses are seen. No inguinal lymphadenopathy is seen.  No acute osseous abnormalities are identified. IMPRESSION: 1. Acute appendicitis, with dilatation of the appendix to 1.4 cm in maximal diameter, with surrounding soft tissue inflammation and trace fluid. No evidence of perforation or abscess formation. 2. Scattered diverticulosis along the proximal sigmoid colon, without evidence of diverticulitis. These results were called by telephone at the time of interpretation on 06/18/2015 at 10:47 pm to Dr. Evelina Bucy, who verbally acknowledged these results. Electronically Signed   By: Garald Balding M.D.   On: 06/18/2015 22:47    Medications / Allergies:  Scheduled Meds: . cefTRIAXone (ROCEPHIN)  IV  2 g Intravenous Q24H   And  . metronidazole  500 mg Intravenous Q8H  . enoxaparin (LOVENOX) injection  40 mg Subcutaneous QHS   Continuous Infusions: . dextrose 5 % and 0.9 % NaCl with KCl 20 mEq/L 100 mL/hr at 06/19/15 0100   PRN Meds:.HYDROmorphone (DILAUDID) injection, ondansetron **OR** ondansetron (ZOFRAN) IV, zolpidem  Antibiotics: Anti-infectives    Start     Dose/Rate Route Frequency Ordered Stop   06/19/15 0000  cefTRIAXone (ROCEPHIN) 2 g in dextrose 5 % 50 mL IVPB     2 g 100 mL/hr over 30 Minutes Intravenous Every 24 hours 06/18/15 2357     06/19/15 0000  metroNIDAZOLE (FLAGYL) IVPB 500 mg     500 mg 100 mL/hr over 60 Minutes Intravenous Every 8 hours 06/18/15 2357          Assessment/Plan Acute appendicitis-to or this am for a laparoscopic appendectomy. Surgical risks including but not limited to infection, bleeding, injury to surrounding structures, anesthesia risks, open surgery discussed.  She wishes to proceed.  I spoke with her husband Doren Custard as well. FEN-NPO, IVF ID-rocephin/flagyl VTE prophylaxis-SCD/lovenox Dispo-OR     Erby Pian,  ANP-BC [facility]   06/19/2015 7:34 AM

## 2015-06-19 NOTE — Interval H&P Note (Signed)
History and Physical Interval Note:  06/19/2015 8:15 AM  Donna Russell  has presented today for surgery, with the diagnosis of Appendicitis  The various methods of treatment have been discussed with the patient and family. After consideration of risks, benefits and other options for treatment, the patient has consented to  Procedure(s): APPENDECTOMY LAPAROSCOPIC (N/A) as a surgical intervention .  The patient's history has been reviewed, patient examined, no change in status, stable for surgery.  I have reviewed the patient's chart and labs.  Questions were answered to the patient's satisfaction.     TOTH III,Emmalia Heyboer S

## 2015-06-19 NOTE — H&P (View-Only) (Signed)
Patient ID: Donna Russell, female   DOB: 08/16/1956, 58 y.o.   MRN: [id number]     [facility]      [address]   [place], [place] [address]    Phone: [phone] FAX: [phone]     Subjective: Sore.  Vomiting.  No sob, cp, palpitations.   Objective:  Vital signs:  Filed Vitals:   06/18/15 2236 06/19/15 0100 06/19/15 0107 06/19/15 0444  BP: 139/69  123/62 119/62  Pulse: 97  91 92  Temp:   99.6 F (37.6 C) 100.3 F (37.9 C)  TempSrc:   Oral Oral  Resp: 14  18 18  Height:  5' 6" (1.676 m)    Weight:  107.956 kg (238 lb)    SpO2: 96%  87% 90%    Last BM Date: 06/18/15  Intake/Output   Yesterday:  10/26 0701 - 10/27 0700 In: 2200 [I.V.:1200; IV Piggyback:1000] Out: -  This shift:    I/O last 3 completed shifts: In: 2200 [I.V.:1200; IV Piggyback:1000] Out: -       Physical Exam: General: Pt awake/alert/oriented x4 in no acute distress Chest: cta.  No chest wall pain w good excursion CV:  Pulses intact.  Regular rhythm MS: Normal AROM mjr joints.  No obvious deformity Abdomen: Soft.  Nondistended.  ttp rlq.   No evidence of peritonitis.  No incarcerated hernias. Ext:  SCDs BLE.  No mjr edema.  No cyanosis Skin: No petechiae / purpura   Problem List:   Active Problems:   Acute appendicitis    Results:   Labs: Results for orders placed or performed during the hospital encounter of 06/18/15 (from the past 48 hour(s))  Lipase, blood     Status: None   Collection Time: 06/18/15  5:41 PM  Result Value Ref Range   Lipase 22 11 - 51 U/L    Comment: Please note change in reference range.  Comprehensive metabolic panel     Status: Abnormal   Collection Time: 06/18/15  5:41 PM  Result Value Ref Range   Sodium 139 135 - 145 mmol/L   Potassium 3.9 3.5 - 5.1 mmol/L   Chloride 103 101 - 111 mmol/L   CO2 27 22 - 32 mmol/L   Glucose, Bld 143 (H) 65 - 99 mg/dL   BUN 12 6 - 20 mg/dL   Creatinine, Ser 0.73 0.44  - 1.00 mg/dL   Calcium 9.3 8.9 - 10.3 mg/dL   Total Protein 7.6 6.5 - 8.1 g/dL   Albumin 4.3 3.5 - 5.0 g/dL   AST 27 15 - 41 U/L   ALT 29 14 - 54 U/L   Alkaline Phosphatase 85 38 - 126 U/L   Total Bilirubin 0.9 0.3 - 1.2 mg/dL   GFR calc non Af Amer >60 >60 mL/min   GFR calc Af Amer >60 >60 mL/min    Comment: (NOTE) The eGFR has been calculated using the CKD EPI equation. This calculation has not been validated in all clinical situations. eGFR's persistently <60 mL/min signify possible Chronic Kidney Disease.    Anion gap 9 5 - 15  CBC     Status: Abnormal   Collection Time: 06/18/15  5:41 PM  Result Value Ref Range   WBC 15.7 (H) 4.0 - 10.5 K/uL   RBC 4.91 3.87 - 5.11 MIL/uL   Hemoglobin 15.1 (H) 12.0 - 15.0 g/dL   HCT 43.2 36.0 - 46.0 %   MCV 88.0 78.0 - 100.0 fL     MCH 30.8 26.0 - 34.0 pg   MCHC 35.0 30.0 - 36.0 g/dL   RDW 12.3 11.5 - 15.5 %   Platelets 263 150 - 400 K/uL  I-stat troponin, ED     Status: None   Collection Time: 06/18/15  5:46 PM  Result Value Ref Range   Troponin i, poc 0.00 0.00 - 0.08 ng/mL   Comment 3            Comment: Due to the release kinetics of cTnI, a negative result within the first hours of the onset of symptoms does not rule out myocardial infarction with certainty. If myocardial infarction is still suspected, repeat the test at appropriate intervals.   Urinalysis, Routine w reflex microscopic (not at [facility])     Status: Abnormal   Collection Time: 06/18/15  7:31 PM  Result Value Ref Range   Color, Urine YELLOW YELLOW   APPearance CLOUDY (A) CLEAR   Specific Gravity, Urine 1.020 1.005 - 1.030   pH 5.5 5.0 - 8.0   Glucose, UA NEGATIVE NEGATIVE mg/dL   Hgb urine dipstick NEGATIVE NEGATIVE   Bilirubin Urine NEGATIVE NEGATIVE   Ketones, ur 15 (A) NEGATIVE mg/dL   Protein, ur NEGATIVE NEGATIVE mg/dL   Urobilinogen, UA 0.2 0.0 - 1.0 mg/dL   Nitrite NEGATIVE NEGATIVE   Leukocytes, UA NEGATIVE NEGATIVE    Comment: MICROSCOPIC NOT DONE ON  URINES WITH NEGATIVE PROTEIN, BLOOD, LEUKOCYTES, NITRITE, OR GLUCOSE <1000 mg/dL.  Glucose, capillary     Status: Abnormal   Collection Time: 06/19/15  4:51 AM  Result Value Ref Range   Glucose-Capillary 180 (H) 65 - 99 mg/dL  CBC     Status: Abnormal   Collection Time: 06/19/15  6:00 AM  Result Value Ref Range   WBC 15.0 (H) 4.0 - 10.5 K/uL   RBC 4.58 3.87 - 5.11 MIL/uL   Hemoglobin 13.6 12.0 - 15.0 g/dL   HCT 40.8 36.0 - 46.0 %   MCV 89.1 78.0 - 100.0 fL   MCH 29.7 26.0 - 34.0 pg   MCHC 33.3 30.0 - 36.0 g/dL   RDW 12.8 11.5 - 15.5 %   Platelets 203 150 - 400 K/uL    Imaging / Studies: Ct Abdomen Pelvis W Contrast  06/18/2015  CLINICAL DATA:  Acute onset of diarrhea. Chills and nausea. Stomach ache. Right lower quadrant abdominal pain. Initial encounter. EXAM: CT ABDOMEN AND PELVIS WITH CONTRAST TECHNIQUE: Multidetector CT imaging of the abdomen and pelvis was performed using the standard protocol following bolus administration of intravenous contrast. CONTRAST:  100mL OMNIPAQUE IOHEXOL 300 MG/ML  SOLN COMPARISON:  None. FINDINGS: Minimal bibasilar atelectasis is noted. The liver and spleen are unremarkable in appearance. The gallbladder is within normal limits. The pancreas and adrenal glands are unremarkable. The kidneys are unremarkable in appearance. There is no evidence of hydronephrosis. No renal or ureteral stones are seen. No perinephric stranding is appreciated. No free fluid is identified. The small bowel is unremarkable in appearance. The stomach is within normal limits. No acute vascular abnormalities are seen. The appendix is dilated to 1.4 cm in maximal diameter, with surrounding soft tissue inflammation and trace fluid. Findings are compatible with acute appendicitis. There is no evidence of perforation or abscess formation. Scattered diverticulosis is noted along the proximal sigmoid colon, without evidence of diverticulitis. The bladder is mildly distended and grossly  unremarkable. The uterus is unremarkable in appearance. The ovaries are relatively symmetric. No suspicious adnexal masses are seen. No inguinal lymphadenopathy is seen.   No acute osseous abnormalities are identified. IMPRESSION: 1. Acute appendicitis, with dilatation of the appendix to 1.4 cm in maximal diameter, with surrounding soft tissue inflammation and trace fluid. No evidence of perforation or abscess formation. 2. Scattered diverticulosis along the proximal sigmoid colon, without evidence of diverticulitis. These results were called by telephone at the time of interpretation on 06/18/2015 at 10:47 pm to Dr. Blair Walden, who verbally acknowledged these results. Electronically Signed   By: Jeffery  Chang M.D.   On: 06/18/2015 22:47    Medications / Allergies:  Scheduled Meds: . cefTRIAXone (ROCEPHIN)  IV  2 g Intravenous Q24H   And  . metronidazole  500 mg Intravenous Q8H  . enoxaparin (LOVENOX) injection  40 mg Subcutaneous QHS   Continuous Infusions: . dextrose 5 % and 0.9 % NaCl with KCl 20 mEq/L 100 mL/hr at 06/19/15 0100   PRN Meds:.HYDROmorphone (DILAUDID) injection, ondansetron **OR** ondansetron (ZOFRAN) IV, zolpidem  Antibiotics: Anti-infectives    Start     Dose/Rate Route Frequency Ordered Stop   06/19/15 0000  cefTRIAXone (ROCEPHIN) 2 g in dextrose 5 % 50 mL IVPB     2 g 100 mL/hr over 30 Minutes Intravenous Every 24 hours 06/18/15 2357     06/19/15 0000  metroNIDAZOLE (FLAGYL) IVPB 500 mg     500 mg 100 mL/hr over 60 Minutes Intravenous Every 8 hours 06/18/15 2357          Assessment/Plan Acute appendicitis-to or this am for a laparoscopic appendectomy. Surgical risks including but not limited to infection, bleeding, injury to surrounding structures, anesthesia risks, open surgery discussed.  She wishes to proceed.  I spoke with her husband Phillip as well. FEN-NPO, IVF ID-rocephin/flagyl VTE prophylaxis-SCD/lovenox Dispo-OR     Reveca Desmarais,  ANP-BC [facility]   06/19/2015 7:34 AM    

## 2015-06-19 NOTE — Anesthesia Procedure Notes (Signed)
Procedure Name: Intubation Date/Time: 06/19/2015 8:35 AM Performed by: Orest DikesPETERS, Willean Schurman J Pre-anesthesia Checklist: Patient identified, Emergency Drugs available, Suction available and Patient being monitored Patient Re-evaluated:Patient Re-evaluated prior to inductionOxygen Delivery Method: Circle System Utilized Preoxygenation: Pre-oxygenation with 100% oxygen Intubation Type: IV induction, Rapid sequence and Cricoid Pressure applied Laryngoscope Size: Mac and 4 Grade View: Grade I Tube type: Oral Tube size: 7.5 mm Number of attempts: 1 Airway Equipment and Method: Stylet and Oral airway Placement Confirmation: ETT inserted through vocal cords under direct vision,  positive ETCO2 and breath sounds checked- equal and bilateral Secured at: 21 cm Tube secured with: Tape Dental Injury: Teeth and Oropharynx as per pre-operative assessment

## 2015-06-19 NOTE — Anesthesia Preprocedure Evaluation (Signed)
Anesthesia Evaluation  Patient identified by MRN, date of birth, ID band Patient awake    Reviewed: Allergy & Precautions, NPO status , Patient's Chart, lab work & pertinent test results  Airway Mallampati: II  TM Distance: >3 FB Neck ROM: Full    Dental no notable dental hx.    Pulmonary former smoker,    Pulmonary exam normal breath sounds clear to auscultation       Cardiovascular hypertension, Pt. on medications Normal cardiovascular exam Rhythm:Regular Rate:Normal     Neuro/Psych negative neurological ROS  negative psych ROS   GI/Hepatic negative GI ROS, Neg liver ROS,   Endo/Other  negative endocrine ROS  Renal/GU negative Renal ROS  negative genitourinary   Musculoskeletal negative musculoskeletal ROS (+)   Abdominal (+) + obese,   Peds negative pediatric ROS (+)  Hematology negative hematology ROS (+)   Anesthesia Other Findings   Reproductive/Obstetrics negative OB ROS                             Anesthesia Physical Anesthesia Plan  ASA: II  Anesthesia Plan: General   Post-op Pain Management:    Induction: Intravenous  Airway Management Planned: Oral ETT  Additional Equipment:   Intra-op Plan:   Post-operative Plan: Extubation in OR  Informed Consent: I have reviewed the patients History and Physical, chart, labs and discussed the procedure including the risks, benefits and alternatives for the proposed anesthesia with the patient or authorized representative who has indicated his/her understanding and acceptance.   Dental advisory given  Plan Discussed with: CRNA  Anesthesia Plan Comments:         Anesthesia Quick Evaluation

## 2015-06-19 NOTE — Anesthesia Postprocedure Evaluation (Signed)
  Anesthesia Post-op Note  Patient: [name]  Procedure(s) Performed: Procedure(s) (LRB): APPENDECTOMY LAPAROSCOPIC (N/A)  Patient Location: PACU  Anesthesia Type: General  Level of Consciousness: awake and alert   Airway and Oxygen Therapy: Patient Spontanous Breathing  Post-op Pain: mild  Post-op Assessment: Post-op Vital signs reviewed, Patient's Cardiovascular Status Stable, Respiratory Function Stable, Patent Airway and No signs of Nausea or vomiting  Last Vitals:  Filed Vitals:   06/19/15 1330  BP: 120/58  Pulse: 70  Temp: 36.8 C  Resp:     Post-op Vital Signs: stable   Complications: No apparent anesthesia complications

## 2015-06-19 NOTE — Transfer of Care (Signed)
Immediate Anesthesia Transfer of Care Note  Patient: Donna Russell  Procedure(s) Performed: Procedure(s): APPENDECTOMY LAPAROSCOPIC (N/A)  Patient Location: PACU  Anesthesia Type:GENERAL  Level of Consciousness:  sedated, patient cooperative and responds to stimulation  Airway & Oxygen Therapy:Patient Spontanous Breathing and Patient connected to face mask oxgen  Post-op Assessment:  Report given to PACU RN and Post -op Vital signs reviewed and stable  Post vital signs:  Reviewed and stable  Last Vitals:  Filed Vitals:   06/19/15 0954  BP:   Pulse: 107  Temp:   Resp: 16    Complications: No apparent anesthesia complications

## 2015-06-19 NOTE — Op Note (Signed)
06/18/2015 - 06/19/2015  9:34 AM  PATIENT:  Donna Russell  59 y.o. female  PRE-OPERATIVE DIAGNOSIS:  Appendicitis  POST-OPERATIVE DIAGNOSIS:  Appendicitis  PROCEDURE:  Procedure(s): APPENDECTOMY LAPAROSCOPIC (N/A)  SURGEON:  Surgeon(s) and Role:    * Griselda Miner, MD - Primary  PHYSICIAN ASSISTANT:   ASSISTANTS: none   ANESTHESIA:   general  EBL:  Total I/O In: 1000 [I.V.:1000] Out: -   BLOOD ADMINISTERED:none  DRAINS: none   LOCAL MEDICATIONS USED:  MARCAINE     SPECIMEN:  Source of Specimen:  appendix  DISPOSITION OF SPECIMEN:  PATHOLOGY  COUNTS:  YES  TOURNIQUET:  * No tourniquets in log *  DICTATION: .Dragon Dictation   After informed consent was obtained patient was brought to the operating room placed in the supine position on the operating room table. After adequate induction of general anesthesia the patient's abdomen was prepped with ChloraPrep, allowed to dry, and draped in usual sterile manner. The area below the umbilicus was infiltrated with quarter percent Marcaine. A small incision was made with a 15 blade knife. This incision was carried down through the subcutaneous tissue bluntly with a hemostat and [employer]-Nav[employer] retractors until the linea alba was identified. The linea alba was incised with a 15 blade knife. Each side was grasped Coker clamps and elevated anteriorly. The preperitoneal space was probed bluntly with a hemostat until the peritoneum was opened and access was gained to the abdominal cavity. A 0 Vicryl purse string stitch was placed in the fascia surrounding the opening. A Hassan cannula was placed through the opening and anchored in place with the previously placed Vicryl purse string stitch. The laparoscope was placed through the [facility] cannula. The abdomen was insufflated with carbon dioxide without difficulty. Next the suprapubic area was infiltrated with quarter percent Marcaine. A small incision was made with a 15 blade knife. A 5 mm port  was placed bluntly through this incision into the abdominal cavity. A site was then chosen in the left upper abdomen for placement of a 5 mm port. The area was infiltrated with quarter percent Marcaine. A small stab incision was made with a 15 blade knife. A 5 mm port was placed bluntly through this incision and the abdominal cavity under direct vision. The laparoscope was then moved to the suprapubic port. Using a Glassman grasper and harmonic scalpel the right lower quadrant was inspected. The appendix was readily identified. The body of the appendix was necrotic and leaking cloudy brown fluid. The base of the appendix appeared healthy. The appendix was elevated anteriorly and the mesoappendix was taken down sharply with the harmonic scalpel. Once the base of the appendix where it joined the cecum was identified and cleared of any tissue then a laparoscopic GIA blue load 6 row stapler was placed through the [facility] cannula. The stapler was placed across the base of the appendix clamped and fired thereby dividing the base of the appendix between staple lines. A laparoscopic bag was then inserted through the [facility] cannula. The appendix was placed within the bag and the bag was sealed. The abdomen was then irrigated with copious amounts of saline until the effluent was clear. No other abnormalities were noted. The appendix and bag were removed with the [facility] cannula through the infraumbilical port without difficulty. The fascial defect was closed with the previously placed Vicryl pursestring stitch as well as with another interrupted 0 Vicryl figure-of-eight stitch. The rest of the ports were removed under direct vision and were found  to be hemostatic. The gas was allowed to escape. The skin incisions were closed with interrupted 4-0 Monocryl subcuticular stitches. Dermabond dressings were applied. The patient tolerated the procedure well. At the end of the case all needle sponge and instrument counts were correct.  The patient was then awakened and taken to recovery in stable condition.  PLAN OF CARE: Admit to inpatient   PATIENT DISPOSITION:  PACU - hemodynamically stable.   Delay start of Pharmacological VTE agent (>24hrs) due to surgical blood loss or risk of bleeding: no

## 2015-06-20 MED ORDER — OXYCODONE-ACETAMINOPHEN 5-325 MG PO TABS
1.0000 | ORAL_TABLET | ORAL | Status: DC | PRN
Start: 2015-06-20 — End: 2015-06-20
  Administered 2015-06-20: 1 via ORAL
  Filled 2015-06-20: qty 1

## 2015-06-20 MED ORDER — METRONIDAZOLE 500 MG PO TABS: 500.0000 mg | ORAL_TABLET | Freq: Three times a day (TID) | ORAL | Status: DC

## 2015-06-20 MED ORDER — OXYCODONE-ACETAMINOPHEN 5-325 MG PO TABS: 1.0000 | ORAL_TABLET | ORAL | Status: DC | PRN

## 2015-06-20 MED ORDER — CIPROFLOXACIN HCL 500 MG PO TABS: 500.0000 mg | ORAL_TABLET | Freq: Two times a day (BID) | ORAL | Status: DC

## 2015-06-20 NOTE — Progress Notes (Signed)
Patient discharged.  Leaving with note to return to work and prescriptions.  Denies pain.  Room air.  Alert & oriented x4.  No complaints.  No s/s of distress.  Accompanied by husband.

## 2015-06-20 NOTE — Discharge Summary (Signed)
Physician Discharge Summary  Patient ID: Donna Russell MRN: [id number] DOB/AGE: [birth year] 59 y.o.  Admit date: 06/18/2015 Discharge date: 06/20/2015  Admitting Diagnosis: Acute appendicitis  Discharge Diagnosis Patient Active Problem List   Diagnosis Date Noted  . Acute appendicitis 06/18/2015    Consultants none  Imaging: Ct Abdomen Pelvis W Contrast  06/18/2015  CLINICAL DATA:  Acute onset of diarrhea. Chills and nausea. Stomach ache. Right lower quadrant abdominal pain. Initial encounter. EXAM: CT ABDOMEN AND PELVIS WITH CONTRAST TECHNIQUE: Multidetector CT imaging of the abdomen and pelvis was performed using the standard protocol following bolus administration of intravenous contrast. CONTRAST:  OMNIPAQUE IOHEXOL 300 MG/ML  SOLN COMPARISON:  None. FINDINGS: Minimal bibasilar atelectasis is noted. The liver and spleen are unremarkable in appearance. The gallbladder is within normal limits. The pancreas and adrenal glands are unremarkable. The kidneys are unremarkable in appearance. There is no evidence of hydronephrosis. No renal or ureteral stones are seen. No perinephric stranding is appreciated. No free fluid is identified. The small bowel is unremarkable in appearance. The stomach is within normal limits. No acute vascular abnormalities are seen. The appendix is dilated to 1.4 cm in maximal diameter, with surrounding soft tissue inflammation and trace fluid. Findings are compatible with acute appendicitis. There is no evidence of perforation or abscess formation. Scattered diverticulosis is noted along the proximal sigmoid colon, without evidence of diverticulitis. The bladder is mildly distended and grossly unremarkable. The uterus is unremarkable in appearance. The ovaries are relatively symmetric. No suspicious adnexal masses are seen. No inguinal lymphadenopathy is seen. No acute osseous abnormalities are identified. IMPRESSION: 1. Acute appendicitis, with dilatation of  the appendix to 1.4 cm in maximal diameter, with surrounding soft tissue inflammation and trace fluid. No evidence of perforation or abscess formation. 2. Scattered diverticulosis along the proximal sigmoid colon, without evidence of diverticulitis. These results were called by telephone at the time of interpretation on 06/18/2015 at 10:47 pm to Dr. Elwin Mocha, who verbally acknowledged these results. Electronically Signed   By: Roanna Raider M.D.   On: 06/18/2015 22:47    Procedures Laparoscopic appendectomy---Dr. Bufford Lope Course:  Donna Russell presented to [facility] with abdominal pain and diarrhea.  Workup showed acute appendicitis.  Patient was admitted and underwent procedure listed above.  Tolerated procedure well and was transferred to the floor.  Diet was advanced as tolerated.  On POD#1, the patient was voiding well, tolerating diet, ambulating well, pain well controlled, vital signs stable, incisions c/d/i and felt stable for discharge home with 5 additional days for appendicitis, gangrenous and a UTI.   Medication risks, benefits and therapeutic alternatives were reviewed with the patient.  She/he verbalizes understanding.  Patient will follow up in our office in 3 weeks and knows to call with questions or concerns.  Physical Exam: General:  Alert, NAD, pleasant, comfortable Abd:  Soft, ND, mild tenderness, incisions C/D/I    Medication List    TAKE these medications        ciprofloxacin 500 MG tablet  Commonly known as:  CIPRO  Take 1 tablet (500 mg total) by mouth 2 (two) times daily.     CO Q 10 PO  Take 1 tablet by mouth daily.     lisinopril 20 MG tablet  Commonly known as:  PRINIVIL,ZESTRIL  Take 20 mg by mouth daily.     metroNIDAZOLE 500 MG tablet  Commonly known as:  FLAGYL  Take 1 tablet (500 mg total) by mouth 3 (three) times  daily.     multivitamin with minerals Tabs tablet  Take 1 tablet by mouth daily.     oxyCODONE-acetaminophen 5-325 MG tablet   Commonly known as:  PERCOCET/ROXICET  Take 1-2 tablets by mouth every 4 (four) hours as needed for moderate pain or severe pain.             Follow-up Information    Follow up with [facility] On 07/09/2015.   Specialty:  General Surgery   Why:  arrive by 8:30AM for a 9AM post operative check up   Contact information:   [address] [place] [id number] [phone]       Signed: Ashok Norrismina Laylamarie Meuser, [facility]NP-BC [facility] [phone]  06/20/2015, 10:04 AM

## 2015-06-20 NOTE — Discharge Instructions (Signed)

## 2015-12-18 ENCOUNTER — Other Ambulatory Visit (HOSPITAL_COMMUNITY): Payer: Self-pay | Admitting: Family Medicine

## 2015-12-18 DIAGNOSIS — Z1231 Encounter for screening mammogram for malignant neoplasm of breast: Secondary | ICD-10-CM

## 2016-01-12 ENCOUNTER — Ambulatory Visit (HOSPITAL_COMMUNITY): Payer: BLUE CROSS/BLUE SHIELD

## 2016-09-06 ENCOUNTER — Ambulatory Visit (HOSPITAL_COMMUNITY)
Admission: RE | Admit: 2016-09-06 | Discharge: 2016-09-06 | Disposition: A | Discharge status: Home / Self Care | Payer: Commercial Managed Care - PPO | Source: Ambulatory Visit | Attending: Family Medicine | Admitting: Family Medicine

## 2016-09-06 DIAGNOSIS — Z1231 Encounter for screening mammogram for malignant neoplasm of breast: Secondary | ICD-10-CM | POA: Insufficient documentation

## 2016-10-30 IMAGING — CT CT ABD-PELV W/ CM
2 of 5 series · 15 of 46 positions shown, 17 images · IV contrast (OMNIPAQUE 300)
Comparison: None.

CLINICAL DATA: Acute onset of diarrhea. Chills and nausea. Stomach
ache. Right lower quadrant abdominal pain. Initial encounter.

EXAM:
CT ABDOMEN AND PELVIS WITH CONTRAST
TECHNIQUE: Multidetector CT imaging of the abdomen and pelvis was performed
using the standard protocol following bolus administration of
intravenous contrast.
CONTRAST:  100mL OMNIPAQUE IOHEXOL 300 MG/ML  SOLN

[Series 2: abd/pel with · axial · 0.77mm/px · z∈[+964,+1399]mm · 12 of 99 slices shown, 14 images]
[im 6/99  soft-tissue]
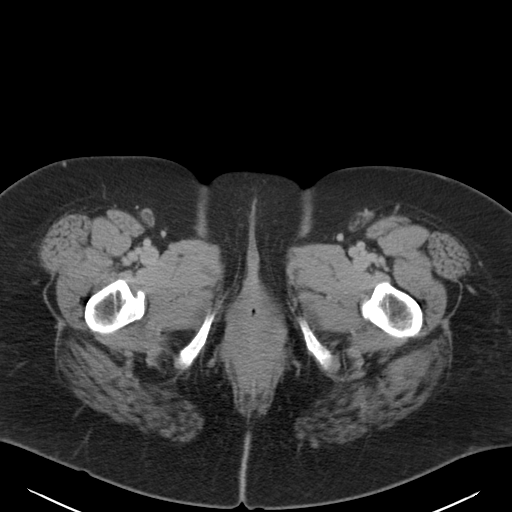
[im 6/99  bone]
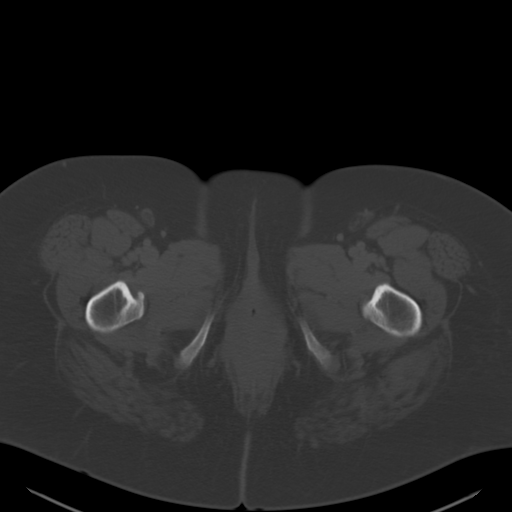
[im 16/99  soft-tissue]
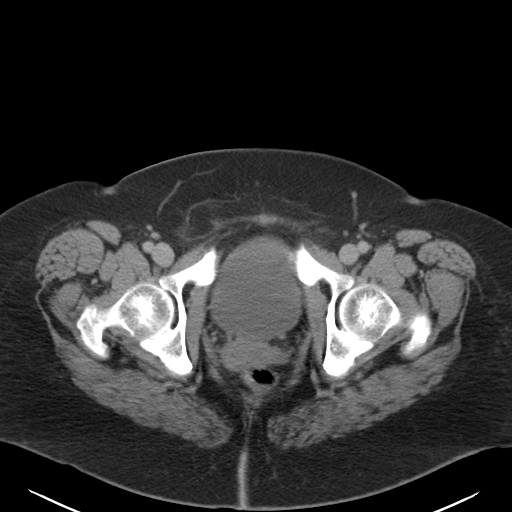
[im 21/99  soft-tissue]
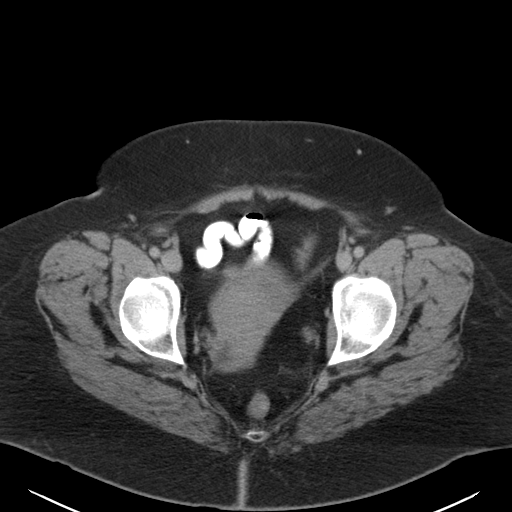
[im 31/99  soft-tissue]
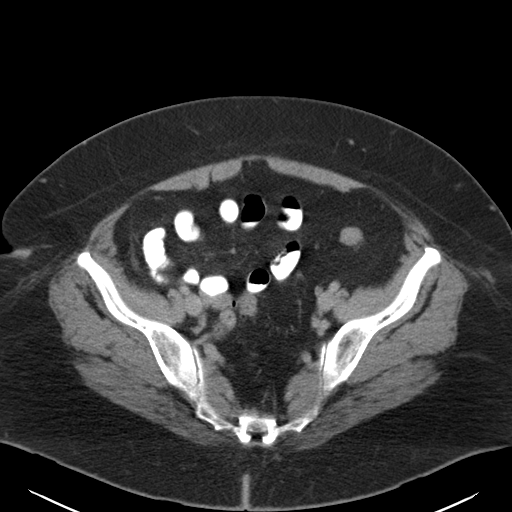
[im 37/99  soft-tissue]
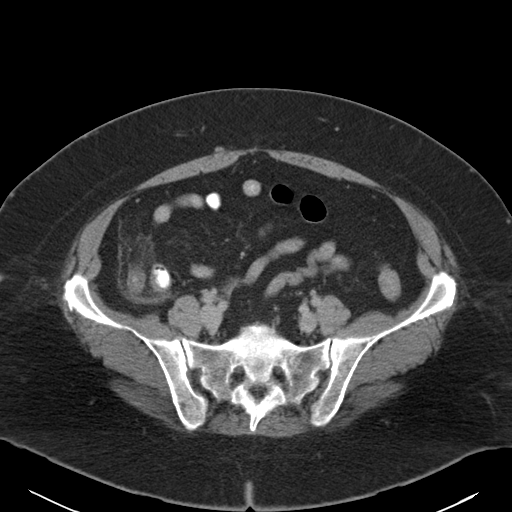
[im 47/99  soft-tissue]
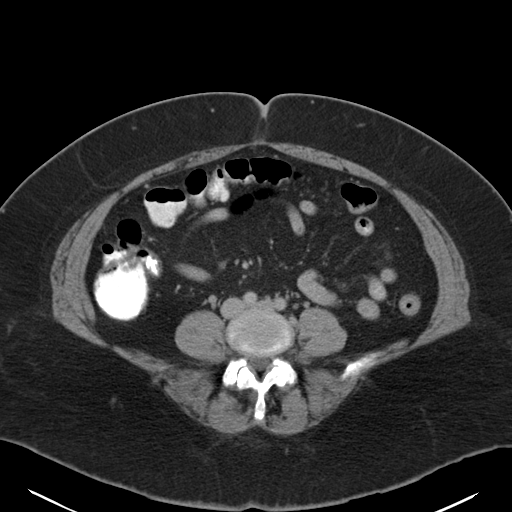
[im 52/99  soft-tissue]
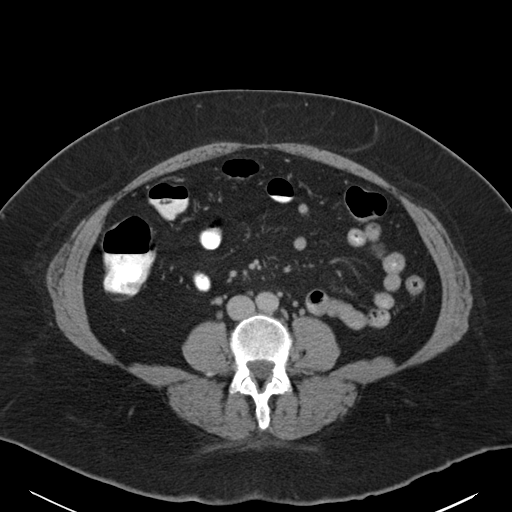
[im 62/99  soft-tissue]
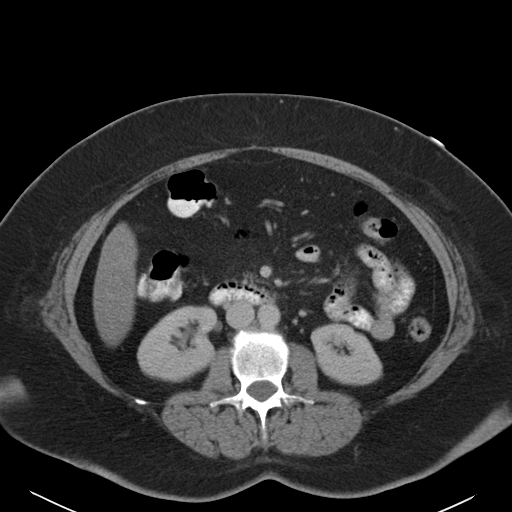
[im 68/99  soft-tissue]
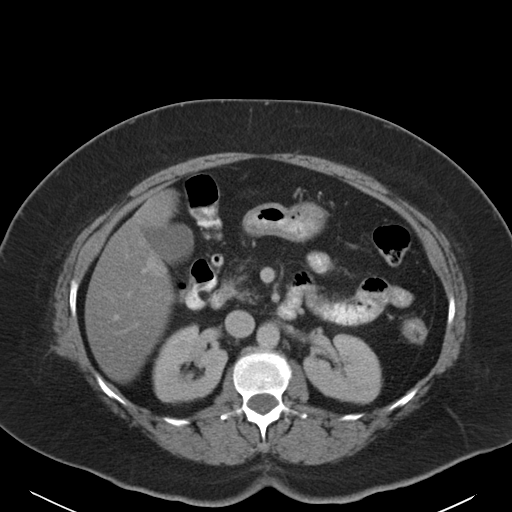
[im 68/99  bone]
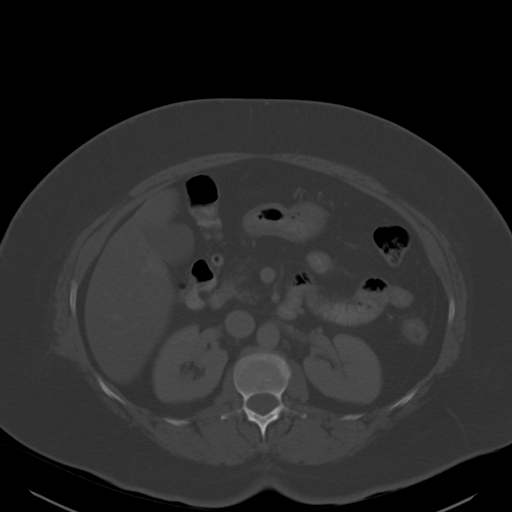
[im 78/99  soft-tissue]
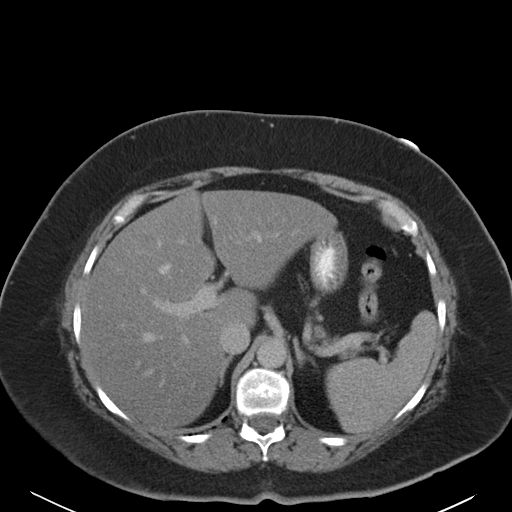
[im 83/99  soft-tissue]
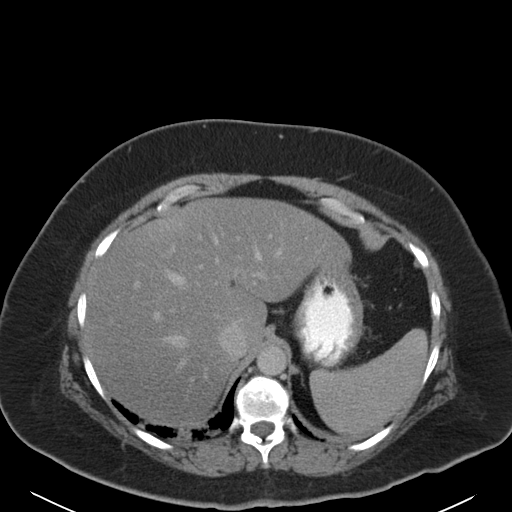
[im 93/99  soft-tissue]
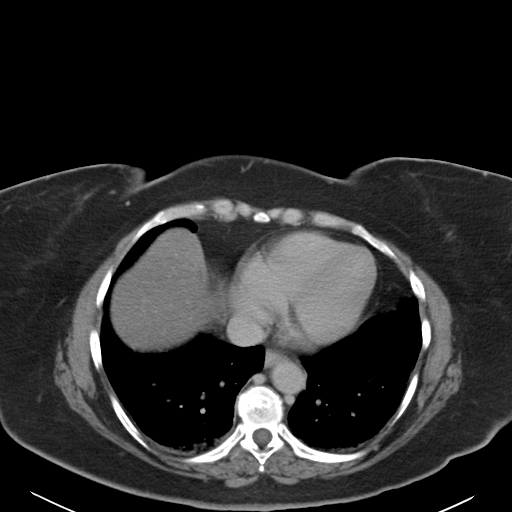

[Series 3: coronal a/|p · coronal · 0.70mm/px · 3 of 127 slices shown]
[im 43/127  soft-tissue]
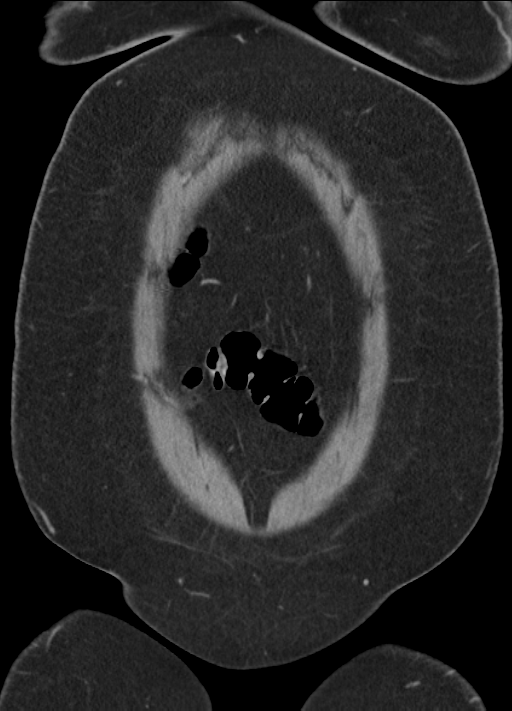
[im 57/127  soft-tissue]
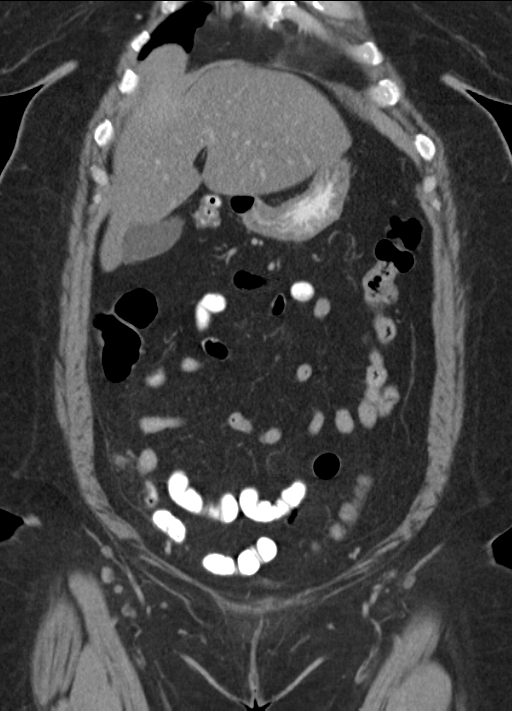
[im 71/127  soft-tissue]
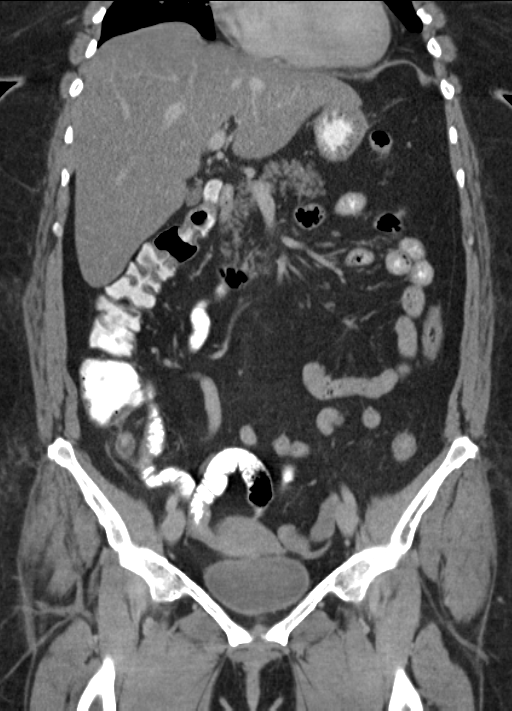

[15 of 46 positions shown; findings below may reference images not displayed]

FINDINGS: Minimal bibasilar atelectasis is noted.

The liver and spleen are unremarkable in appearance. The gallbladder
is within normal limits. The pancreas and adrenal glands are
unremarkable.

The kidneys are unremarkable in appearance. There is no evidence of
hydronephrosis. No renal or ureteral stones are seen. No perinephric
stranding is appreciated.

No free fluid is identified. The small bowel is unremarkable in
appearance. The stomach is within normal limits. No acute vascular
abnormalities are seen.

The appendix is dilated to 1.4 cm in maximal diameter, with
surrounding soft tissue inflammation and trace fluid. Findings are
compatible with acute appendicitis. There is no evidence of
perforation or abscess formation.

Scattered diverticulosis is noted along the proximal sigmoid colon,
without evidence of diverticulitis.

The bladder is mildly distended and grossly unremarkable. The uterus
is unremarkable in appearance. The ovaries are relatively symmetric.
No suspicious adnexal masses are seen. No inguinal lymphadenopathy
is seen.

No acute osseous abnormalities are identified.
IMPRESSION: 1. Acute appendicitis, with dilatation of the appendix to 1.4 cm in
maximal diameter, with surrounding soft tissue inflammation and
trace fluid. No evidence of perforation or abscess formation.
2. Scattered diverticulosis along the proximal sigmoid colon,
without evidence of diverticulitis.
These results were called by telephone at the time of interpretation
on 06/18/2015 at [DATE] to Dr. Henneh Ayaga, who verbally
acknowledged these results.

## 2018-07-12 ENCOUNTER — Other Ambulatory Visit (HOSPITAL_COMMUNITY): Payer: Self-pay | Admitting: Family Medicine

## 2018-07-12 DIAGNOSIS — Z1231 Encounter for screening mammogram for malignant neoplasm of breast: Secondary | ICD-10-CM

## 2018-07-31 ENCOUNTER — Ambulatory Visit (HOSPITAL_COMMUNITY)
Admission: RE | Admit: 2018-07-31 | Discharge: 2018-07-31 | Disposition: A | Discharge status: Home / Self Care | Payer: Commercial Managed Care - PPO | Source: Ambulatory Visit | Attending: Family Medicine | Admitting: Family Medicine

## 2018-07-31 ENCOUNTER — Encounter (HOSPITAL_COMMUNITY): Payer: Self-pay

## 2018-07-31 DIAGNOSIS — Z1231 Encounter for screening mammogram for malignant neoplasm of breast: Secondary | ICD-10-CM | POA: Insufficient documentation

## 2018-08-03 ENCOUNTER — Encounter: Payer: Self-pay | Admitting: Registered"

## 2018-08-03 ENCOUNTER — Encounter: Payer: Commercial Managed Care - PPO | Attending: Family Medicine | Admitting: Registered"

## 2018-08-03 DIAGNOSIS — E119 Type 2 diabetes mellitus without complications: Secondary | ICD-10-CM | POA: Insufficient documentation

## 2018-08-03 DIAGNOSIS — Z713 Dietary counseling and surveillance: Secondary | ICD-10-CM | POA: Diagnosis not present

## 2018-08-03 DIAGNOSIS — I10 Essential (primary) hypertension: Secondary | ICD-10-CM | POA: Insufficient documentation

## 2018-08-03 NOTE — Progress Notes (Signed)
Diabetes Self-Management Education  Visit Type: First/Initial  Appt. Start Time: 1415 Appt. End Time: 1530  08/03/2018  [name], identified by name and date of birth, is a 61 y.o. female with a diagnosis of Diabetes: Type 2.   ASSESSMENT A1c 6.9% as of 12/22/17. Pt states her doctor did not do another A1c test at her last visit, but just judged from her SMBG that she needed to start metformin. Pt states she has had occasional diarrhea, states she sometimes takes metformin on an empty stomach at night.  Pt states she has been checking BG in the morning and when she gets home from work ~5 pm, before dinner, BG ranging 93-127 mg/dL. Pt has not seen post meal number go above 200 but does not check that often.   Patient states she is lactose intolerant and was using lactose free milk, but trying to drink unsweetened almond milk to reduce carbs. Pt states she has cut back on sodas, but still drinking as well as started drinking pink lemonade. Pt states she enjoys desserts. Pt states she thinks her portion sizes may be too large. Pt states she will start splitting meals with husband when they eat out.  Pt states she and her husband eats out ~1x week, sometimes drinks sweet tea with meal. Typical meal might be hot dogs, french fries, hush puppies OR hamburger steak plate, onion rings.  Sleep: 10 - 5:20 am ~8 hrs. Most of the time feels like she gets enough sleep. Stress: low at work, loves job. Pt states she is concerned about husbands health, terminal cancer Physical activity: Active at work 6-10k steps per day, drives a fork lift, picks up 30lb boxes. No other structured activity.  Diabetes Self-Management Education - 08/03/18 1626      Visit Information   Visit Type  First/Initial      Initial Visit   Diabetes Type  Type 2    Are you currently following a meal plan?  No    Are you taking your medications as prescribed?  Yes    Date Diagnosed  last may      Health Coping   How  would you rate your overall health?  Good      Psychosocial Assessment   Patient Belief/Attitude about Diabetes  Motivated to manage diabetes    How often do you need to have someone help you when you read instructions, pamphlets, or other written materials from your doctor or pharmacy?  1 - Never    What is the last grade level you completed in school?  94BS      Complications   Last HgB A1C per patient/outside source  6.9 %    How often do you check your blood sugar?  1-2 times/day    Fasting Blood glucose range (mg/dL)  130-179;180-200   160-182   Have you had a dilated eye exam in the past 12 months?  No    Have you had a dental exam in the past 12 months?  No    Are you checking your feet?  Yes    How many days per week are you checking your feet?  7      Dietary Intake   Breakfast  muffin OR cereal, rasin bran crunch    Snack (morning)  none OR apple OR vending maching maybe coffee cake    Lunch  sandwich, chips    Snack (afternoon)  none    Dinner  meat, vegetable, carb potato or  mac & cheese    Snack (evening)  kettlecorn    Beverage(s)  water, kinda quit soda 2x week still has, country time lemonade 3-4 nights per week      Exercise   Exercise Type  ADL's    How many days per week to you exercise?  0    How many minutes per day do you exercise?  0    Total minutes per week of exercise  0      Patient Education   Previous Diabetes Education  No    Nutrition management   Role of diet in the treatment of diabetes and the relationship between the three main macronutrients and blood glucose level;Reviewed blood glucose goals for pre and post meals and how to evaluate the patients' food intake on their blood glucose level.    Physical activity and exercise   Role of exercise on diabetes management, blood pressure control and cardiac health.    Medications  Reviewed patients medication for diabetes, action, purpose, timing of dose and side effects.    Monitoring  Identified  appropriate SMBG and/or A1C goals.    Psychosocial adjustment  Role of stress on diabetes      Individualized Goals (developed by patient)   Nutrition  General guidelines for healthy choices and portions discussed    Physical Activity  Exercise 3-5 times per week    Monitoring   test my blood glucose as discussed      Outcomes   Expected Outcomes  Demonstrated interest in learning. Expect positive outcomes    Future DMSE  4-6 wks    Program Status  Completed      Individualized Plan for Diabetes Self-Management Training:   Learning Objective:  Patient will have a greater understanding of diabetes self-management. Patient education plan is to attend individual and/or group sessions per assessed needs and concerns.   Patient Instructions  Doristine Devoid plan to use your bike after work Aim to eat balanced meals and snacks Consider aiming for 30-45 g carbs per meal, 15 g carbs or less per snack always including protein Aim to eat vegetables at 2 meals per day. Continue taking medication as directed by MD Continue checking blood sugar in the morning fasting and 2 hours after some meals.   Expected Outcomes:  Demonstrated interest in learning. Expect positive outcomes  Education material provided: ADA Diabetes: Your Take Control Guide, A1C conversion sheet and Carbohydrate counting sheet, accu-chek guide flyer  If problems or questions, patient to contact team via:  Phone  Future DSME appointment: 4-6 wks

## 2018-08-03 NOTE — Patient Instructions (Signed)
Great plan to use your bike after work Aim to eat balanced meals and snacks Consider aiming for 30-45 g carbs per meal, 15 g carbs or less per snack always including protein Aim to eat vegetables at 2 meals per day. Continue taking medication as directed by MD Continue checking blood sugar in the morning fasting and 2 hours after some meals.

## 2019-06-12 ENCOUNTER — Encounter: Payer: Self-pay | Admitting: Family Medicine

## 2019-06-12 ENCOUNTER — Other Ambulatory Visit: Payer: Self-pay

## 2019-06-12 ENCOUNTER — Ambulatory Visit: Payer: PRIVATE HEALTH INSURANCE | Admitting: Family Medicine

## 2019-06-12 VITALS — BP 119/78 | HR 70 | Temp 98.0°F | Resp 17 | Ht 65.5 in | Wt 237.5 lb

## 2019-06-12 DIAGNOSIS — E119 Type 2 diabetes mellitus without complications: Secondary | ICD-10-CM | POA: Insufficient documentation

## 2019-06-12 DIAGNOSIS — I10 Essential (primary) hypertension: Secondary | ICD-10-CM | POA: Insufficient documentation

## 2019-06-12 DIAGNOSIS — Z23 Encounter for immunization: Secondary | ICD-10-CM | POA: Diagnosis not present

## 2019-06-12 DIAGNOSIS — Z0000 Encounter for general adult medical examination without abnormal findings: Secondary | ICD-10-CM

## 2019-06-12 LAB — POCT GLYCOSYLATED HEMOGLOBIN (HGB A1C)
HbA1c POC (<> result, manual entry): 6.5 % (ref 4.0–5.6)
HbA1c, POC (controlled diabetic range): 6.5 % (ref 0.0–7.0)
HbA1c, POC (prediabetic range): 6.5 % — AB (ref 5.7–6.4)
Hemoglobin A1C: 6.5 % — AB (ref 4.0–5.6)

## 2019-06-12 MED ORDER — METFORMIN HCL 500 MG PO TABS: 500.0000 mg | ORAL_TABLET | Freq: Two times a day (BID) | ORAL | 1 refills | Status: DC

## 2019-06-12 MED ORDER — LISINOPRIL 20 MG PO TABS: 20.0000 mg | ORAL_TABLET | Freq: Every day | ORAL | 1 refills | Status: DC

## 2019-06-12 NOTE — Progress Notes (Signed)
Patient ID: Donna Russell, female  DOB: 06/07/1956, 63 y.o.   MRN: [id number] Patient Care Team    Relationship Specialty Notifications Start End  Ma Hillock, DO PCP - General Family Medicine  06/12/19     Chief Complaint  Patient presents with  . Establish Care    [place] [facility]. DM and HTN. Needs refills. Pt would like to change Lisinopril due to chronic cough     Subjective:  Donna Russell is a 63 y.o.  female present for new patient establishment. All past medical history, surgical history, allergies, family history, immunizations, medications and social history were updated in the electronic medical record today. All recent labs, ED visits and hospitalizations within the last year were reviewed.  Diabetes/obesity: diagnosed early 2020 w/ a1c 7. Pt reports compliance with metformin 500 mg twice daily. Denies numbness, tingling of extremities, hypo/hyperglycemic events or non-healing wounds. Pt reports BG ranges 110-180.  PNA series: Pneumovax started today.  Prevnar in 1 year. Flu shot: Flu shot administered today (recommneded yearly) BMP: 12/20/2018 creatinine 0.74 Microalbumin creatinine ratio 12/20/2018 within normal limits. Foot exam: Completed today. Eye exam: Dr. Marin Comment, [facility] within last year A1c: 7.0>> 6.5  Hypertension:  Pt reports compliance with lisinopril 20 mg daily. Blood pressures ranges at home in normal limits. Patient denies chest pain, shortness of breath or lower extremity edema. Pt does not take a daily baby ASA. Pt is not prescribed  statin. CBC: Completed over a year ago. TSH: Completed over a year ago. Lipid panel: 12/20/2018 total cholesterol 174, LDL 91, HDL 41.  Triglycerides 211. RF: Hypertension, obesity, diabetes, family history of heart disease.   Depression screen [facility] 2/9 06/12/2019 08/03/2018  Decreased Interest 0 0  Down, Depressed, Hopeless 0 0  PHQ - 2 Score 0 0   No flowsheet data found.      Fall  Risk  08/03/2018  Falls in the past year? 0    There is no immunization history on file for this patient.  No exam data present  Past Medical History:  Diagnosis Date  . Chicken pox   . Diabetes mellitus without complication ([place])   . Hypertension    No Known Allergies Past Surgical History:  Procedure Laterality Date  . DILATION AND CURETTAGE OF UTERUS    . LAPAROSCOPIC APPENDECTOMY N/A 06/19/2015   Procedure: APPENDECTOMY LAPAROSCOPIC;  Surgeon: Autumn Messing III, MD;  Location: [facility];  Service: General;  Laterality: N/A;   Family History  Problem Relation Age of Onset  . Diabetes Mother   . Hypertension Mother   . Hyperlipidemia Mother   . Stomach cancer Father   . Hypertension Father   . Hypertension Sister   . Hypertension Brother   . Diabetes Maternal Grandmother   . Diabetes Maternal Grandfather   . Heart disease Paternal Grandmother   . Hypertension Paternal Grandmother   . Heart disease Paternal Grandfather   . Hyperlipidemia Paternal Grandfather   . Hypertension Paternal Grandfather   . Hypertension Brother    Social History   Social History Narrative  . Not on file    Allergies as of 06/12/2019   No Known Allergies     Medication List       Accurate as of June 12, 2019  2:06 PM. If you have any questions, ask your nurse or doctor.        ciprofloxacin 500 MG tablet Commonly known as: Cipro Take 1 tablet (500  mg total) by mouth 2 (two) times daily.   CO Q 10 PO Take 1 tablet by mouth daily.   lisinopril 20 MG tablet Commonly known as: ZESTRIL Take 20 mg by mouth daily.   metFORMIN 500 MG tablet Commonly known as: GLUCOPHAGE Take 500 mg by mouth daily. Pt will take second 500mg  metformin if needed   metroNIDAZOLE 500 MG tablet Commonly known as: Flagyl Take 1 tablet (500 mg total) by mouth 3 (three) times daily.   multivitamin with minerals Tabs tablet Take 1 tablet by mouth daily.   oxyCODONE-acetaminophen 5-325 MG tablet  Commonly known as: PERCOCET/ROXICET Take 1-2 tablets by mouth every 4 (four) hours as needed for moderate pain or severe pain.   VITAMIN B-12 PO Take by mouth.   VITAMIN D PO Take by mouth.       All past medical history, surgical history, allergies, family history, immunizations andmedications were updated in the EMR today and reviewed under the history and medication portions of their EMR.    No results found for this or any previous visit (from the past 2160 hour(s)).  ROS: 14 pt review of systems performed and negative (unless mentioned in an HPI)  Objective: BP 119/78 (BP Location: Left Arm, Patient Position: Sitting, Cuff Size: Large)   Pulse 70   Temp 98 F (36.7 C) (Temporal)   Resp 17   Ht 5' 5.5" (1.664 m)   Wt 237 lb 8 oz (107.7 kg)   SpO2 98%   BMI 38.92 kg/m  Gen: Afebrile. No acute distress. Nontoxic in appearance, well-developed, well-nourished, pleasant, obese Caucasian female. HENT: AT. [place].  Eyes:Pupils Equal Round Reactive to light, Extraocular movements intact,  Conjunctiva without redness, discharge or icterus. Neck/lymp/endocrine: Supple, no lymphadenopathy, no thyromegaly CV: RRR no murmur, no edema, +2/4 P posterior tibialis pulses.  Chest: CTAB, no wheeze, rhonchi or crackles.  Abd: Soft.  Obese. NTND. BS present.  No masses palpated. No hepatosplenomegaly. No rebound tenderness or guarding. Skin: No rashes, purpura or petechiae. Warm and well-perfused. Skin intact. Neuro/Msk:  Normal gait. PERLA. EOMi. Alert. Oriented x3.  Psych: Normal affect, dress and demeanor. Normal speech. Normal thought content and judgment. Diabetic Foot Exam - Simple   Simple Foot Form Diabetic Foot exam was performed with the following findings: Yes 06/12/2019  6:12 PM  Visual Inspection No deformities, no ulcerations, no other skin breakdown bilaterally: Yes Sensation Testing Intact to touch and monofilament testing bilaterally: Yes Pulse Check Posterior Tibialis and  Dorsalis pulse intact bilaterally: Yes Comments      Assessment/plan: Donna Russell is a 63 y.o. female present for est care. Need for influenza vaccination - Flu Vaccine QUAD 36+ mos IM  Type 2 diabetes mellitus without complication, without long-term current use of insulin (HCC) Stable.  Fairly new diagnosis for her. -A1c was 7>> 6.5 today. -Continue Metformin twice daily.  Medications refilled for her today. - POCT glycosylated hemoglobin (Hb A1C) -Diabetic diet encouraged.  Routine exercise. - PNA series: Pneumovax started today.  Prevnar in 1 year. Flu shot: Flu shot administered today (recommneded yearly) BMP: 12/20/2018 creatinine 0.74 Microalbumin creatinine ratio 12/20/2018 within normal limits. Foot exam: Completed today. Eye exam: Dr. Conley RollsLe, [facility] within last year Follow-up every 4 to 6 months for chronic conditions.  Essential hypertension/Morbid obesity (HCC) Stable. Continue lisinopril 20 mg daily.  Refills provided today.  If cough continues may consider change medication to losartan. Low-sodium diet encouraged; routine exercise encouraged Labs will be collected at her  next visit which should be her physical. Follow-up in 3 months for her physical, then will see routinely every 4-6 months for chronic conditions.  Need for pneumococcal vaccination - Pneumococcal polysaccharide vaccine 23-valent greater than or equal to 2yo subcutaneous/IM   No follow-ups on file. Orders Placed This Encounter  Procedures  . Flu Vaccine QUAD 36+ mos IM  . Pneumococcal polysaccharide vaccine 23-valent greater than or equal to 2yo subcutaneous/IM  . POCT glycosylated hemoglobin (Hb A1C)    Follow up in 3-4 months for her physical with fasting labs, and then follow every 3 to 4 months for her chronic conditions thereafter.  Greater than 45 minutes was spent with patient, greater than 50% of that time was spent face-to-face    Note is dictated utilizing voice  recognition software. Although note has been proof read prior to signing, occasional typographical errors still can be missed. If any questions arise, please do not hesitate to call for verification.  Electronically signed by: Felix Pacini, DO [facility] [place]

## 2019-06-12 NOTE — Patient Instructions (Signed)
It was nice to meet you today.  Follow up in 3 months for your physical.   Your a1c was great- 6.5.  Continue medications as prescribed.    Please help [place] help you:  We are honored you have chosen [facility] for your Primary Care home. Below you will find basic instructions that you may need to access in the future. Please help [place] help you by reading the instructions, which cover many of the frequent questions we experience.   Prescription refills and request:  -In order to allow more efficient response time, please call your pharmacy for all refills. They will forward the request electronically to [place]. This allows for the quickest possible response. Request left on a nurse line can take longer to refill, since these are checked as time allows between office patients and other phone calls.  - refill request can take up to 3-5 working days to complete.  - If request is sent electronically and request is appropiate, it is usually completed in 1-2 business days.  - all patients will need to be seen routinely for all chronic medical conditions requiring prescription medications (see follow-up below). If you are overdue for follow up on your condition, you will be asked to make an appointment and we will call in enough medication to cover you until your appointment (up to 30 days).  - all controlled substances will require a face to face visit to request/refill.  - if you desire your prescriptions to go through a new pharmacy, and have an active script at original pharmacy, you will need to call your pharmacy and have scripts transferred to new pharmacy. This is completed between the pharmacy locations and not by your provider.    Results: If any images or labs were ordered, it can take up to 1 week to get results depending on the test ordered and the lab/facility running and resulting the test. - Normal or stable results, which do not need further discussion, may be released to your mychart  immediately with attached note to you. A call may not be generated for normal results. Please make certain to sign up for mychart. If you have questions on how to activate your mychart you can call the front office.  - If your results need further discussion, our office will attempt to contact you via phone, and if unable to reach you after 2 attempts, we will release your abnormal result to your mychart with instructions.  - All results will be automatically released in mychart after 1 week.  - Your provider will provide you with explanation and instruction on all relevant material in your results. Please keep in mind, results and labs may appear confusing or abnormal to the untrained eye, but it does not mean they are actually abnormal for you personally. If you have any questions about your results that are not covered, or you desire more detailed explanation than what was provided, you should make an appointment with your provider to do so.   Our office handles many outgoing and incoming calls daily. If we have not contacted you within 1 week about your results, please check your mychart to see if there is a message first and if not, then contact our office.  In helping with this matter, you help decrease call volume, and therefore allow [place] to be able to respond to patients needs more efficiently.   Acute office visits (sick visit):  An acute visit is intended for a new problem and  are scheduled in shorter time slots to allow schedule openings for patients with new problems. This is the appropriate visit to discuss a new problem. Problems will not be addressed by phone call or Echart message. Appointment is needed if requesting treatment. In order to provide you with excellent quality medical care with proper time for you to explain your problem, have an exam and receive treatment with instructions, these appointments should be limited to one new problem per visit. If you experience a new problem, in  which you desire to be addressed, please make an acute office visit, we save openings on the schedule to accommodate you. Please do not save your new problem for any other type of visit, let us take care of it properly and quickly for you.   Follow up visits:  Depending on your condition(s) your provider will need to see you routinely in order to provide you with quality care and prescribe medication(s). Most chronic conditions (Example: hypertension, Diabetes, depression/anxiety... etc), require visits a couple times a year. Your provider will instruct you on proper follow up for your personal medical conditions and history. Please make certain to make follow up appointments for your condition as instructed. Failing to do so could result in lapse in your medication treatment/refills. If you request a refill, and are overdue to be seen on a condition, we will always provide you with a 30 day script (once) to allow you time to schedule.    Medicare wellness (well visit): - we have a wonderful Nurse Maudie Mercury), that will meet with you and provide you will yearly medicare wellness visits. These visits should occur yearly (can not be scheduled less than 1 calendar year apart) and cover preventive health, immunizations, advance directives and screenings you are entitled to yearly through your medicare benefits. Do not miss out on your entitled benefits, this is when medicare will pay for these benefits to be ordered for you.  These are strongly encouraged by your provider and is the appropriate type of visit to make certain you are up to date with all preventive health benefits. If you have not had your medicare wellness exam in the last 12 months, please make certain to schedule one by calling the office and schedule your medicare wellness with Maudie Mercury as soon as possible.   Yearly physical (well visit):  - Adults are recommended to be seen yearly for physicals. Check with your insurance and date of your last physical,  most insurances require one calendar year between physicals. Physicals include all preventive health topics, screenings, medical exam and labs that are appropriate for gender/age and history. You may have fasting labs needed at this visit. This is a well visit (not a sick visit), new problems should not be covered during this visit (see acute visit).  - Pediatric patients are seen more frequently when they are younger. Your provider will advise you on well child visit timing that is appropriate for your their age. - This is not a medicare wellness visit. Medicare wellness exams do not have an exam portion to the visit. Some medicare companies allow for a physical, some do not allow a yearly physical. If your medicare allows a yearly physical you can schedule the medicare wellness with our nurse Maudie Mercury and have your physical with your provider after, on the same day. Please check with insurance for your full benefits.   Late Policy/No Shows:  - all new patients should arrive 15-30 minutes earlier than appointment to allow [place] time  to  obtain all personal demographics,  insurance information and for you to complete office paperwork. - All established patients should arrive 10-15 minutes earlier than appointment time to update all information and be checked in .  - In our best efforts to run on time, if you are late for your appointment you will be asked to either reschedule or if able, we will work you back into the schedule. There will be a wait time to work you back in the schedule,  depending on availability.  - If you are unable to make it to your appointment as scheduled, please call 24 hours ahead of time to allow [place] to fill the time slot with someone else who needs to be seen. If you do not cancel your appointment ahead of time, you may be charged a no show fee.

## 2019-07-16 ENCOUNTER — Telehealth: Payer: Self-pay | Admitting: Family Medicine

## 2019-07-16 NOTE — Telephone Encounter (Signed)
Pt called and stated she went to get refill of medication and fluid pill was not with the HCTZ. Pt states she takes the combo or separated, but takes HCTZ for leg swelling. Pt has picked up the Lisinopril already at the pharmacy.  NP appt was on 06/12/2019   HCTZ-Lisinopril 12.5-20mg   Please advise

## 2019-07-16 NOTE — Telephone Encounter (Signed)
Patient is requesting a call back about her Lisinopril Rx today if possible. Patient states Rx is wrong.

## 2019-07-17 MED ORDER — HYDROCHLOROTHIAZIDE 12.5 MG PO TABS: 12.5000 mg | ORAL_TABLET | Freq: Every day | ORAL | 1 refills | Status: DC

## 2019-07-17 NOTE — Addendum Note (Signed)
Addended by: Howard Pouch A on: 07/17/2019 07:13 AM   Modules accepted: Orders

## 2019-07-17 NOTE — Telephone Encounter (Signed)
Please call pt back.  She newly established and the HCTZ was not on her med list supplied by her  or combo med listed. Reviewed med-list from hosp encounter 2016 and no HCTZ listed either.  Please inform her: Adding a low dose HCTZ separately for fluid retention prn is not a problem, but she does not need it BP wise daily, by her prior blood pressures. I did call in the separate pill today, only use if noting fluid retention.

## 2019-07-17 NOTE — Telephone Encounter (Signed)
Pt was called and given information, she verbalizes understanding  

## 2019-08-24 DIAGNOSIS — M8580 Other specified disorders of bone density and structure, unspecified site: Secondary | ICD-10-CM

## 2019-08-24 HISTORY — DX: Other specified disorders of bone density and structure, unspecified site: M85.80

## 2019-10-15 ENCOUNTER — Other Ambulatory Visit (HOSPITAL_COMMUNITY)
Admission: RE | Admit: 2019-10-15 | Discharge: 2019-10-15 | Disposition: A | Discharge status: Home / Self Care | Payer: 59 | Source: Ambulatory Visit | Attending: Family Medicine | Admitting: Family Medicine

## 2019-10-15 ENCOUNTER — Ambulatory Visit (INDEPENDENT_AMBULATORY_CARE_PROVIDER_SITE_OTHER): Payer: 59 | Admitting: Family Medicine

## 2019-10-15 ENCOUNTER — Encounter: Payer: Self-pay | Admitting: Family Medicine

## 2019-10-15 ENCOUNTER — Other Ambulatory Visit: Payer: Self-pay

## 2019-10-15 VITALS — BP 122/78 | HR 65 | Temp 97.6°F | Resp 17 | Ht 65.5 in | Wt 232.2 lb

## 2019-10-15 DIAGNOSIS — Z0000 Encounter for general adult medical examination without abnormal findings: Secondary | ICD-10-CM | POA: Diagnosis not present

## 2019-10-15 DIAGNOSIS — Z1231 Encounter for screening mammogram for malignant neoplasm of breast: Secondary | ICD-10-CM

## 2019-10-15 DIAGNOSIS — Z1159 Encounter for screening for other viral diseases: Secondary | ICD-10-CM

## 2019-10-15 DIAGNOSIS — I10 Essential (primary) hypertension: Secondary | ICD-10-CM

## 2019-10-15 DIAGNOSIS — Z01419 Encounter for gynecological examination (general) (routine) without abnormal findings: Secondary | ICD-10-CM | POA: Insufficient documentation

## 2019-10-15 DIAGNOSIS — E119 Type 2 diabetes mellitus without complications: Secondary | ICD-10-CM

## 2019-10-15 DIAGNOSIS — Z23 Encounter for immunization: Secondary | ICD-10-CM

## 2019-10-15 DIAGNOSIS — Z1211 Encounter for screening for malignant neoplasm of colon: Secondary | ICD-10-CM | POA: Diagnosis not present

## 2019-10-15 DIAGNOSIS — E2839 Other primary ovarian failure: Secondary | ICD-10-CM

## 2019-10-15 LAB — TSH: TSH: 3.7 u[IU]/mL (ref 0.35–4.50)

## 2019-10-15 LAB — MICROALBUMIN / CREATININE URINE RATIO
Creatinine,U: 203.5 mg/dL
Microalb Creat Ratio: 0.8 mg/g (ref 0.0–30.0)
Microalb, Ur: 1.6 mg/dL (ref 0.0–1.9)

## 2019-10-15 LAB — CBC
HCT: 44.6 % (ref 36.0–46.0)
Hemoglobin: 15 g/dL (ref 12.0–15.0)
MCHC: 33.7 g/dL (ref 30.0–36.0)
MCV: 90.6 fl (ref 78.0–100.0)
Platelets: 279 10*3/uL (ref 150.0–400.0)
RBC: 4.92 Mil/uL (ref 3.87–5.11)
RDW: 13.3 % (ref 11.5–15.5)
WBC: 4.9 10*3/uL (ref 4.0–10.5)

## 2019-10-15 LAB — HEMOGLOBIN A1C: Hgb A1c MFr Bld: 7.1 % — ABNORMAL HIGH (ref 4.6–6.5)

## 2019-10-15 MED ORDER — METFORMIN HCL 500 MG PO TABS: 500.0000 mg | ORAL_TABLET | Freq: Two times a day (BID) | ORAL | 1 refills | Status: DC

## 2019-10-15 MED ORDER — LISINOPRIL 20 MG PO TABS: 20.0000 mg | ORAL_TABLET | Freq: Every day | ORAL | 1 refills | Status: DC

## 2019-10-15 MED ORDER — HYDROCHLOROTHIAZIDE 12.5 MG PO TABS: 12.5000 mg | ORAL_TABLET | Freq: Every day | ORAL | 1 refills | Status: DC

## 2019-10-15 NOTE — Patient Instructions (Addendum)
COVID-19 Vaccine Information can be found at: [email or web address].nl For questions related to vaccine distribution or appointments, please email [email or web address] or call [phone].  Covid Vaccine appointment go to [email or web address]  Mammogram, bone density and cologuard ordered for you today.   Health Maintenance, Female Adopting a healthy lifestyle and getting preventive care are important in promoting health and wellness. Ask your health care provider about:  The right schedule for you to have regular tests and exams.  Things you can do on your own to prevent diseases and keep yourself healthy. What should I know about diet, weight, and exercise? Eat a healthy diet   Eat a diet that includes plenty of vegetables, fruits, low-fat dairy products, and lean protein.  Do not eat a lot of foods that are high in solid fats, added sugars, or sodium. Maintain a healthy weight Body mass index (BMI) is used to identify weight problems. It estimates body fat based on height and weight. Your health care provider can help determine your BMI and help you achieve or maintain a healthy weight. Get regular exercise Get regular exercise. This is one of the most important things you can do for your health. Most adults should:  Exercise for at least 150 minutes each week. The exercise should increase your heart rate and make you sweat (moderate-intensity exercise).  Do strengthening exercises at least twice a week. This is in addition to the moderate-intensity exercise.  Spend less time sitting. Even light physical activity can be beneficial. Watch cholesterol and blood lipids Have your blood tested for lipids and cholesterol at 63 years of age, then have this test every 5 years. Have your cholesterol levels checked more often if:  Your lipid or cholesterol levels are high.  You are older than 63 years of age.  You are at  high risk for heart disease. What should I know about cancer screening? Depending on your health history and family history, you may need to have cancer screening at various ages. This may include screening for:  Breast cancer.  Cervical cancer.  Colorectal cancer.  Skin cancer.  Lung cancer. What should I know about heart disease, diabetes, and high blood pressure? Blood pressure and heart disease  High blood pressure causes heart disease and increases the risk of stroke. This is more likely to develop in people who have high blood pressure readings, are of African descent, or are overweight.  Have your blood pressure checked: ? Every 3-5 years if you are 33-68 years of age. ? Every year if you are 93 years old or older. Diabetes Have regular diabetes screenings. This checks your fasting blood sugar level. Have the screening done:  Once every three years after age 27 if you are at a normal weight and have a low risk for diabetes.  More often and at a younger age if you are overweight or have a high risk for diabetes. What should I know about preventing infection? Hepatitis B If you have a higher risk for hepatitis B, you should be screened for this virus. Talk with your health care provider to find out if you are at risk for hepatitis B infection. Hepatitis C Testing is recommended for:  Everyone born from [birth year] through 1964.  Anyone with known risk factors for hepatitis C. Sexually transmitted infections (STIs)  Get screened for STIs, including gonorrhea and chlamydia, if: ? You are sexually active and are younger than 63 years of age. ? You are older than 63 years of age and your health  care provider tells you that you are at risk for this type of infection. ? Your sexual activity has changed since you were last screened, and you are at increased risk for chlamydia or gonorrhea. Ask your health care provider if you are at risk.  Ask your health care provider about whether  you are at high risk for HIV. Your health care provider may recommend a prescription medicine to help prevent HIV infection. If you choose to take medicine to prevent HIV, you should first get tested for HIV. You should then be tested every 3 months for as long as you are taking the medicine. Pregnancy  If you are about to stop having your period (premenopausal) and you may become pregnant, seek counseling before you get pregnant.  Take 400 to 800 micrograms (mcg) of folic acid every day if you become pregnant.  Ask for birth control (contraception) if you want to prevent pregnancy. Osteoporosis and menopause Osteoporosis is a disease in which the bones lose minerals and strength with aging. This can result in bone fractures. If you are 67 years old or older, or if you are at risk for osteoporosis and fractures, ask your health care provider if you should:  Be screened for bone loss.  Take a calcium or vitamin D supplement to lower your risk of fractures.  Be given hormone replacement therapy (HRT) to treat symptoms of menopause. Follow these instructions at home: Lifestyle  Do not use any products that contain nicotine or tobacco, such as cigarettes, e-cigarettes, and chewing tobacco. If you need help quitting, ask your health care provider.  Do not use street drugs.  Do not share needles.  Ask your health care provider for help if you need support or information about quitting drugs. Alcohol use  Do not drink alcohol if: ? Your health care provider tells you not to drink. ? You are pregnant, may be pregnant, or are planning to become pregnant.  If you drink alcohol: ? Limit how much you use to 0-1 drink a day. ? Limit intake if you are breastfeeding.  Be aware of how much alcohol is in your drink. In the [place] one drink equals one 12 oz bottle of beer (355 mL), one 5 oz glass of wine (148 mL), or one 1 oz glass of hard liquor (44 mL). General instructions  Schedule regular  health, dental, and eye exams.  Stay current with your vaccines.  Tell your health care provider if: ? You often feel depressed. ? You have ever been abused or do not feel safe at home. Summary  Adopting a healthy lifestyle and getting preventive care are important in promoting health and wellness.  Follow your health care provider's instructions about healthy diet, exercising, and getting tested or screened for diseases.  Follow your health care provider's instructions on monitoring your cholesterol and blood pressure. This information is not intended to replace advice given to you by your health care provider. Make sure you discuss any questions you have with your health care provider. Document Revised: 08/02/2018 Document Reviewed: 08/02/2018 Elsevier Patient Education  2020 [employer].

## 2019-10-15 NOTE — Progress Notes (Addendum)
This visit occurred during the SARS-CoV-2 public health emergency.  Safety protocols were in place, including screening questions prior to the visit, additional usage of staff PPE, and extensive cleaning of exam room while observing appropriate contact time as indicated for disinfecting solutions.    Patient ID: Donna Russell, female  DOB: Mar 11, 1956, 64 y.o.   MRN: [id number] Patient Care Team    Relationship Specialty Notifications Start End  Natalia Leatherwood, DO PCP - General Family Medicine  06/12/19   Griselda Miner, MD Consulting Physician General Surgery  06/12/19   Tilda Burrow, MD Consulting Physician Obstetrics and Gynecology  06/12/19   Michaelle Copas, MD Referring Physician Optometry  06/12/19     Chief Complaint  Patient presents with  . Annual Exam    Fasting. Pap smear 2016 and would like today, Mammogram 07/31/2018. Never had colonsoopy.     Subjective:  Donna Russell is a 64 y.o.  Female  present for CPE. All past medical history, surgical history, allergies, family history, immunizations, medications and social history were updated in the electronic medical record today. All recent labs, ED visits and hospitalizations within the last year were reviewed.  Health maintenance:  Colonoscopy: cologuard ordered today/ Mammogram: completed:2019 at [facility]. Ordered today.  Cervical cancer screening: completed today with co-test.  Immunizations: tdap UTD 2014, Influenza utd 2020 (encouraged yearly), PNA23 05/2019> prvnar due 05/2020. Shingrix #1 today (rpt at next DM appt). Covid vaccine education provided.  Infectious disease screening: HIV declined, Hep C completed today DEXA: ordered today Assistive device: none Oxygen EUM:PNTI Patient has a Dental home. Hospitalizations/ED visits: reviewed  Diabetes/obesity: diagnosed early 2020 w/ a1c 7. Pt reports compliance with metformin 500 mg twice daily. Patient denies dizziness, hyperglycemic or hypoglycemic events.  Patient denies numbness, tingling in the extremities or nonhealing wounds of feet.  . Pt reports BG ranges 120 PNA series: Pneumovax 05/2019.  Prevnar in 1 year. Flu shot: UTD 2020(recommneded yearly) BMP: 12/20/2018 creatinine 0.74.collected today Microalbumin creatinine ratio 12/20/2018 within normal limits. Collected today Foot exam: CUTD 05/2019 Eye exam: Dr. Conley Rolls, [facility] within last year A1c: 7.0>> 6.5  Hypertension:  Pt reports compliance with lisinopril 20 mg daily. Blood pressures ranges at home in normal limits. Patient denies chest pain, shortness of breath, dizziness or lower extremity edema. pt does not take a daily baby ASA. Pt is not prescribed  statin. RF: Hypertension, obesity, diabetes, family history of heart disease  Depression screen [facility] 2/9 10/15/2019 06/12/2019 08/03/2018  Decreased Interest 0 0 0  Down, Depressed, Hopeless 0 0 0  PHQ - 2 Score 0 0 0   No flowsheet data found.   Immunization History  Administered Date(s) Administered  . Influenza,inj,Quad PF,6+ Mos 06/12/2019  . Pneumococcal Polysaccharide-23 06/12/2019  . Tdap 10/13/2012    Past Medical History:  Diagnosis Date  . Chicken pox   . Diabetes mellitus without complication (HCC)   . Hypertension    No Known Allergies Past Surgical History:  Procedure Laterality Date  . DILATION AND CURETTAGE OF UTERUS    . LAPAROSCOPIC APPENDECTOMY N/A 06/19/2015   Procedure: APPENDECTOMY LAPAROSCOPIC;  Surgeon: Chevis Pretty III, MD;  Location: [facility];  Service: General;  Laterality: N/A;   Family History  Problem Relation Age of Onset  . Diabetes Mother   . Hypertension Mother   . Hyperlipidemia Mother   . Stomach cancer Father   . Hypertension Father   . Hypertension Sister   .  Hypertension Brother   . Diabetes Maternal Grandmother   . Diabetes Maternal Grandfather   . Heart disease Paternal Grandmother   . Hypertension Paternal Grandmother   . Heart disease Paternal Grandfather   .  Hyperlipidemia Paternal Grandfather   . Hypertension Paternal Grandfather   . Hypertension Brother    Social History   Social History Narrative   Marital status/children/pets: Recently widowed 01/2019.  3 children.   Education/employment: High school education.   Safety:      -smoke alarm in the home:Yes     - wears seatbelt: Yes       Allergies as of 10/15/2019   No Known Allergies     Medication List       Accurate as of October 15, 2019  9:17 AM. If you have any questions, ask your nurse or doctor.        CO Q 10 PO Take 1 tablet by mouth daily.   hydrochlorothiazide 12.5 MG tablet Commonly known as: HYDRODIURIL Take 1 tablet (12.5 mg total) by mouth daily.   lisinopril 20 MG tablet Commonly known as: ZESTRIL Take 1 tablet (20 mg total) by mouth daily.   metFORMIN 500 MG tablet Commonly known as: GLUCOPHAGE Take 1 tablet (500 mg total) by mouth 2 (two) times daily with a meal. Pt will take second 500mg  metformin if needed What changed:   when to take this  additional instructions   multivitamin with minerals Tabs tablet Take 1 tablet by mouth daily.   VITAMIN B-12 PO Take by mouth.   VITAMIN D PO Take by mouth.       All past medical history, surgical history, allergies, family history, immunizations andmedications were updated in the EMR today and reviewed under the history and medication portions of their EMR.     No results found for this or any previous visit (from the past 2160 hour(s)).  MM 3D SCREEN BREAST BILATERAL  Result Date: 08/01/2018 CLINICAL DATA:  Screening. EXAM: DIGITAL SCREENING BILATERAL MAMMOGRAM WITH TOMO AND CAD COMPARISON:  Previous exam(s). ACR Breast Density Category b: There are scattered areas of fibroglandular density. FINDINGS: There are no findings suspicious for malignancy. Images were processed with CAD. IMPRESSION: No mammographic evidence of malignancy. A result letter of this screening mammogram will be mailed  directly to the patient. RECOMMENDATION: Screening mammogram in one year. (Code:SM-B-01Y) BI-RADS CATEGORY  1: Negative. Electronically Signed   By: 14/05/2018 M.D.   On: 08/01/2018 12:51     ROS: 14 pt review of systems performed and negative (unless mentioned in an HPI)  Objective: BP 122/78 (BP Location: Right Arm, Patient Position: Sitting, Cuff Size: Normal)   Pulse 65   Temp 97.6 F (36.4 C) (Temporal)   Resp 17   Ht 5' 5.5" (1.664 m)   Wt 232 lb 4 oz (105.3 kg)   SpO2 99%   BMI 38.06 kg/m  Gen: Afebrile. No acute distress. Nontoxic in appearance, well-developed, well-nourished, very pleasant overweight Caucasian female HENT: AT. Donna Russell. Bilateral TM visualized and normal in appearance, normal external auditory canal. MMM, no oral lesions, adequate dentition. Bilateral nares within normal limits. Throat without erythema, ulcerations or exudates.  No cough on exam, no hoarseness on exam. Eyes:Pupils Equal Round Reactive to light, Extraocular movements intact,  Conjunctiva without redness, discharge or icterus. Neck/lymp/endocrine: Supple, no lymphadenopathy, no thyromegaly CV: RRR no murmur, no edema, +2/4 P posterior tibialis pulses. Chest: CTAB, no wheeze, rhonchi or crackles.  Normal respiratory effort.  No air  movement. Abd: Soft.  Flat. NTND. BS present.  No masses palpated. No hepatosplenomegaly. No rebound tenderness or guarding. Skin: No rashes, purpura or petechiae. Warm and well-perfused. Skin intact. Neuro/Msk:  Normal gait. PERLA. EOMi. Alert. Oriented x3.  Cranial nerves II through XII intact. Muscle strength 5/5 upper/lower extremity. DTRs equal bilaterally. Psych: Normal affect, dress and demeanor. Normal speech. Normal thought content and judgment. Breasts: breasts appear normal, symmetrical, no tenderness on exam, no suspicious masses, no skin or nipple changes or axillary nodes. GYN:  External genitalia within normal limits, normal hair distribution, no lesions.  Urethral meatus normal, no lesions. Vaginal mucosa pink, moist, normal rugae, no lesions. No cystocele or rectocele. cervix without lesions, no discharge. Bimanual exam revealed normal uterus.  No bladder/suprapubic fullness, masses or tenderness. No cervical motion tenderness. No adnexal fullness. Anus and perineum within normal limits, no lesions.  No exam data present  Assessment/plan: Donna Russell is a 64 y.o. female present for CPE Essential hypertension/morbid obesity - stable.  -Continue lisinopril 20 mg daily - continue HCTZ 12.5 mg daily - CBC - Comprehensive metabolic panel - Lipid panel - TSH -Follow-up every 4 months with chronic medical conditions  Type 2 diabetes mellitus without complication, without long-term current use of insulin (HCC) - Hemoglobin A1c collected today - She has lost 5 lbs!!! - continue Metformin 500 mg twice daily PNA series: Pneumovax 05/2019.  Prevnar in 1 year. Flu shot:UTD 2020 (recommneded yearly) BMP: 12/20/2018 creatinine 0.74.collected today Microalbumin creatinine ratio 12/20/2018 within normal limits. Collected today Foot exam: CUTD 05/2019 Eye exam: Dr. Marin Comment, [facility] within last year A1c: 7.0>> 6.5 - f/u 4 mos.  Encounter for gynecological examination with Papanicolaou smear of cervix - Cytology - PAP w/ cotest Encounter for hepatitis C screening test for low risk patient - Hepatitis C Antibody Colon cancer screening - Cologuard Encounter for screening mammogram for malignant neoplasm of breast - MM 3 SCREEN BREAST BILATERAL; Future Estrogen deficiency - DG Bone Density; Future Encounter for preventive health examination Patient was encouraged to exercise greater than 150 minutes a week. Patient was encouraged to choose a diet filled with fresh fruits and vegetables, and lean meats. AVS provided to patient today for education/recommendation on gender specific health and safety maintenance. Colonoscopy: cologuard  ordered today Mammogram:  Ordered today.  Cervical cancer screening: completed today with co-test.  Immunizations: tdap UTD 2014, Influenza utd 2020 (encouraged yearly), PNA23 05/2019> prvnar due 05/2020. Shingrix #1 today (rpt at next DM appt). Covid vaccine education provided.  Infectious disease screening: HIV declined, Hep C completed today DEXA: ordered today  Return in about 1 year (around 10/14/2020) for CPE (30 min) and 4 mos [facility].  shingrix #2 at 4 mos follow up on DM  Orders Placed This Encounter  Procedures  . MM 3D SCREEN BREAST BILATERAL  . DG Bone Density  . CBC  . Comprehensive metabolic panel  . Hemoglobin A1c  . Lipid panel  . TSH  . Hepatitis C Antibody  . Cologuard  . Urine Microalbumin w/creat. ratio    Orders Placed This Encounter  Procedures  . MM 3D SCREEN BREAST BILATERAL  . DG Bone Density  . CBC  . Comprehensive metabolic panel  . Hemoglobin A1c  . Lipid panel  . TSH  . Hepatitis C Antibody  . Cologuard  . Urine Microalbumin w/creat. ratio   Meds ordered this encounter  Medications  . lisinopril (ZESTRIL) 20 MG tablet    Sig: Take 1 tablet (  20 mg total) by mouth daily.    Dispense:  90 tablet    Refill:  1  . hydrochlorothiazide (HYDRODIURIL) 12.5 MG tablet    Sig: Take 1 tablet (12.5 mg total) by mouth daily.    Dispense:  90 tablet    Refill:  1  . metFORMIN (GLUCOPHAGE) 500 MG tablet    Sig: Take 1 tablet (500 mg total) by mouth 2 (two) times daily with a meal. Pt will take second 500mg  metformin if needed    Dispense:  180 tablet    Refill:  1   Referral Orders  No referral(s) requested today     Electronically signed by: , DO [facility]

## 2019-10-16 ENCOUNTER — Telehealth: Payer: Self-pay | Admitting: Family Medicine

## 2019-10-16 LAB — LIPID PANEL
Cholesterol: 192 mg/dL (ref 0–200)
HDL: 47.4 mg/dL (ref >39.00)
LDL Cholesterol: 109 mg/dL — ABNORMAL HIGH (ref 0–99)
NonHDL: 144.54
Total CHOL/HDL Ratio: 4
Triglycerides: 180 mg/dL — ABNORMAL HIGH (ref 0.0–149.0)
VLDL: 36 mg/dL (ref 0.0–40.0)

## 2019-10-16 LAB — COMPREHENSIVE METABOLIC PANEL
ALT: 23 U/L (ref 0–35)
AST: 21 U/L (ref 0–37)
Albumin: 4.3 g/dL (ref 3.5–5.2)
Alkaline Phosphatase: 82 U/L (ref 39–117)
BUN: 16 mg/dL (ref 6–23)
CO2: 27 mEq/L (ref 19–32)
Calcium: 9.6 mg/dL (ref 8.4–10.5)
Chloride: 103 mEq/L (ref 96–112)
Creatinine, Ser: 0.75 mg/dL (ref 0.40–1.20)
GFR: 78.22 mL/min (ref >60.00)
Glucose, Bld: 171 mg/dL — ABNORMAL HIGH (ref 70–99)
Potassium: 3.9 mEq/L (ref 3.5–5.1)
Sodium: 139 mEq/L (ref 135–145)
Total Bilirubin: 0.6 mg/dL (ref 0.2–1.2)
Total Protein: 7 g/dL (ref 6.0–8.3)

## 2019-10-16 LAB — HEPATITIS C ANTIBODY
Hepatitis C Ab: NONREACTIVE
SIGNAL TO CUT-OFF: 0.01 (ref <1.00)

## 2019-10-16 LAB — CYTOLOGY - PAP
Comment: NEGATIVE
Diagnosis: NEGATIVE
High risk HPV: NEGATIVE

## 2019-10-16 MED ORDER — ATORVASTATIN CALCIUM 10 MG PO TABS: 10.0000 mg | ORAL_TABLET | Freq: Every day | ORAL | 3 refills | Status: DC

## 2019-10-16 MED ORDER — METFORMIN HCL 1000 MG PO TABS: 1000.0000 mg | ORAL_TABLET | Freq: Two times a day (BID) | ORAL | 1 refills | Status: DC

## 2019-10-16 NOTE — Telephone Encounter (Signed)
Please inform patient the following information: - liver, kidneys and thyroid are functioning normal.   - Electrolytes and blood cell counts are normal range. - A1c has increased to 7.1 from 6.5.  Which means her medication does need to be increased.  Increased dose from Metformin 500 mg twice daily to 1000 mg twice daily.    -She can finish the bottle of Metformin she has by taking 2 tabs of her 500 mg tabs twice a day.  The new bottle called in today will be 1000 mg/tab (so when she picks up her new bottle-she needs to remember just to take 1 tab twice a day.) -Lastly with her diagnosis of hypertension and diabetes, American heart association recommendations are that she be prescribed a statin medication to provide her cardiovascular protection.  Her cholesterol levels are a little above goal as well given her chronic conditions.  I have called in a low-dose statin medication called a atorvastatin for her to take daily to help protect her heart and cardiovascular system, as well as decrease her cholesterol and to goal.  Please make sure she has follow-up in 3 months on her chronic conditions since her A1c is rising.  We will also be rechecking her cholesterol panel at that appointment as well.

## 2019-10-16 NOTE — Telephone Encounter (Signed)
Pt is only taking the Metformin 500mg  once daily  because she is unable to tolerate the medication. She gets severe GI upset with taking anymore than 500mg  daily. Pt asking if diet is a option (for diabetes and Lipids), especially if she cuts her soda intake, or another medication that does not have the GI side effects.

## 2019-10-17 MED ORDER — GLIPIZIDE 5 MG PO TABS: 2.5000 mg | ORAL_TABLET | Freq: Every day | ORAL | 1 refills | Status: DC

## 2019-10-17 MED ORDER — METFORMIN HCL 500 MG PO TABS: 500.0000 mg | ORAL_TABLET | Freq: Every day | ORAL | 1 refills | Status: DC

## 2019-10-17 NOTE — Addendum Note (Signed)
Addended by: Felix Pacini A on: 10/17/2019 07:58 AM   Modules accepted: Orders

## 2019-10-17 NOTE — Telephone Encounter (Signed)
Called pt and LM to return call.

## 2019-10-17 NOTE — Telephone Encounter (Signed)
Okay.  Since unable to tolerate higher doses I changed her Metformin prescription back to the 500 mg tablet and she can take once in the morning. She will need additional diabetes coverage with her A1c elevated.  Of course, increasing her exercise and following a diabetic diet will be helpful for her diabetes.  However, with her A1c elevated like it is she still does need additional medication coverage. -I have called in a medication called glipizide at a very low dose.  Start half of a tab before first meal of the day.  Can be taken at the same time his Metformin if desired as long as before meal.

## 2019-10-17 NOTE — Telephone Encounter (Signed)
Pt was called and given information, she verbalized understanding  

## 2019-10-22 ENCOUNTER — Other Ambulatory Visit: Payer: Self-pay

## 2019-10-22 ENCOUNTER — Encounter: Payer: Self-pay | Admitting: Family Medicine

## 2019-10-22 ENCOUNTER — Ambulatory Visit (HOSPITAL_COMMUNITY)
Admission: RE | Admit: 2019-10-22 | Discharge: 2019-10-22 | Disposition: A | Discharge status: Home / Self Care | Payer: 59 | Source: Ambulatory Visit | Attending: Family Medicine | Admitting: Family Medicine

## 2019-10-22 DIAGNOSIS — E2839 Other primary ovarian failure: Secondary | ICD-10-CM | POA: Diagnosis present

## 2019-10-22 DIAGNOSIS — Z1231 Encounter for screening mammogram for malignant neoplasm of breast: Secondary | ICD-10-CM | POA: Insufficient documentation

## 2019-10-23 NOTE — Telephone Encounter (Signed)
Results were reviewed with patient.

## 2019-10-23 NOTE — Telephone Encounter (Signed)
Patient calling about bone density test results.  Please call [phone]

## 2019-10-30 LAB — COLOGUARD: Cologuard: NEGATIVE

## 2020-02-09 ENCOUNTER — Encounter (HOSPITAL_COMMUNITY): Payer: Self-pay | Admitting: Emergency Medicine

## 2020-02-09 ENCOUNTER — Emergency Department (HOSPITAL_COMMUNITY): Payer: 59

## 2020-02-09 ENCOUNTER — Other Ambulatory Visit: Payer: Self-pay

## 2020-02-09 ENCOUNTER — Emergency Department (HOSPITAL_COMMUNITY)
Admission: EM | Admit: 2020-02-09 | Discharge: 2020-02-09 | Disposition: A | Discharge status: Home / Self Care | Payer: 59 | Attending: Emergency Medicine | Admitting: Emergency Medicine

## 2020-02-09 DIAGNOSIS — Y999 Unspecified external cause status: Secondary | ICD-10-CM | POA: Insufficient documentation

## 2020-02-09 DIAGNOSIS — I10 Essential (primary) hypertension: Secondary | ICD-10-CM | POA: Diagnosis not present

## 2020-02-09 DIAGNOSIS — Z79899 Other long term (current) drug therapy: Secondary | ICD-10-CM | POA: Insufficient documentation

## 2020-02-09 DIAGNOSIS — Y929 Unspecified place or not applicable: Secondary | ICD-10-CM | POA: Insufficient documentation

## 2020-02-09 DIAGNOSIS — S99921A Unspecified injury of right foot, initial encounter: Secondary | ICD-10-CM | POA: Insufficient documentation

## 2020-02-09 DIAGNOSIS — Z7984 Long term (current) use of oral hypoglycemic drugs: Secondary | ICD-10-CM | POA: Diagnosis not present

## 2020-02-09 DIAGNOSIS — Z87891 Personal history of nicotine dependence: Secondary | ICD-10-CM | POA: Diagnosis not present

## 2020-02-09 DIAGNOSIS — Z23 Encounter for immunization: Secondary | ICD-10-CM | POA: Diagnosis not present

## 2020-02-09 DIAGNOSIS — X58XXXA Exposure to other specified factors, initial encounter: Secondary | ICD-10-CM | POA: Insufficient documentation

## 2020-02-09 DIAGNOSIS — Y939 Activity, unspecified: Secondary | ICD-10-CM | POA: Insufficient documentation

## 2020-02-09 DIAGNOSIS — E119 Type 2 diabetes mellitus without complications: Secondary | ICD-10-CM | POA: Insufficient documentation

## 2020-02-09 MED ORDER — HYDROCODONE-ACETAMINOPHEN 5-325 MG PO TABS: 1.0000 | ORAL_TABLET | Freq: Four times a day (QID) | ORAL | 0 refills | Status: DC | PRN

## 2020-02-09 MED ORDER — TETANUS-DIPHTH-ACELL PERTUSSIS 5-2.5-18.5 LF-MCG/0.5 IM SUSP
0.5000 mL | Freq: Once | INTRAMUSCULAR | Status: AC
  Administered 2020-02-09: 0.5 mL via INTRAMUSCULAR
  Filled 2020-02-09: qty 0.5

## 2020-02-09 MED ORDER — LIDOCAINE HCL (PF) 1 % IJ SOLN
10.0000 mL | Freq: Once | INTRAMUSCULAR | Status: AC
  Administered 2020-02-09: 10 mL
  Filled 2020-02-09: qty 30

## 2020-02-09 MED ORDER — CEPHALEXIN 500 MG PO CAPS: 500.0000 mg | ORAL_CAPSULE | Freq: Two times a day (BID) | ORAL | 0 refills | Status: DC

## 2020-02-09 NOTE — ED Notes (Signed)
ED Provider at bedside. 

## 2020-02-09 NOTE — ED Provider Notes (Signed)
Procedure performed for attending, Dr. Estell Harpin patient.  See his note for HPI, disposition   NERVE BLOCK Performed by: Linwood Dibbles Consent: Verbal consent obtained. Required items: required blood products, implants, devices, and special equipment available Time out: Immediately prior to procedure a "time out" was called to verify the correct patient, procedure, equipment, support staff and site/side marked as required.  Indication: pain Nerve block body site: lateral great toe  Preparation: Patient was prepped and draped in the usual sterile fashion. Needle gauge: 24 G Location technique: anatomical landmarks  Local anesthetic: Lido WO epi 1%  Anesthetic total: 5 ml  Outcome: pain improved Patient tolerance: Patient tolerated the procedure well with no immediate complications.  LACERATION REPAIR Performed by: Linwood Dibbles Authorized by: Linwood Dibbles Consent: Verbal consent obtained. Risks and benefits: risks, benefits and alternatives were discussed Consent given by: patient Patient identity confirmed: provided demographic data Prepped and Draped in normal sterile fashion Wound explored  Laceration Location: left great toe nail bed  Laceration Length: .4 cm  No Foreign Bodies seen or palpated  Anesthesia: local infiltration  Local anesthetic: lidocaine 1% WO epinephrine  Anesthetic total: 2 ml  Irrigation method: syringe Amount of cleaning: standard  Skin closure: Chromic Gut  Number of sutures: 1  Technique: simple interrupted  Patient tolerance: Patient tolerated the procedure well with no immediate complications.    Juluis Fitzsimmons A, PA-C 02/09/20 [id number]    Bethann Berkshire, MD 02/11/20 [id number]

## 2020-02-09 NOTE — Discharge Instructions (Addendum)
Follow-up with your podiatrist this week.  Keep the bandage on until Monday then clean it twice a day with soap and water

## 2020-02-09 NOTE — ED Provider Notes (Signed)
[facility] EMERGENCY DEPARTMENT Provider Note   CSN: [id number] Arrival date & time: 02/09/20  1626     History Chief Complaint  Patient presents with  . Toe Pain    Donna Russell is a 64 y.o. female.  Patient injured her right large toe and the toenail is hanging on.  The history is provided by the patient and medical records. No language interpreter was used.  Toe Pain This is a new problem. The current episode started 6 to 12 hours ago. The problem occurs constantly. The problem has not changed since onset.Pertinent negatives include no chest pain, no abdominal pain and no headaches. Nothing aggravates the symptoms. Nothing relieves the symptoms. She has tried nothing for the symptoms. The treatment provided no relief.       Past Medical History:  Diagnosis Date  . Chicken pox   . Diabetes mellitus without complication (HCC)   . Hypertension   . Osteopenia 2021    Patient Active Problem List   Diagnosis Date Noted  . Osteopenia 2021  . Type 2 diabetes mellitus without complications (HCC) 06/12/2019  . HTN (hypertension) 06/12/2019  . Morbid obesity (HCC) 06/12/2019    Past Surgical History:  Procedure Laterality Date  . DILATION AND CURETTAGE OF UTERUS    . LAPAROSCOPIC APPENDECTOMY N/A 06/19/2015   Procedure: APPENDECTOMY LAPAROSCOPIC;  Surgeon: Chevis Pretty III, MD;  Location: [facility];  Service: General;  Laterality: N/A;     OB History    Gravida  5   Para  3   Term      Preterm      AB      Living  3     SAB      TAB      Ectopic      Multiple      Live Births              Family History  Problem Relation Age of Onset  . Diabetes Mother   . Hypertension Mother   . Hyperlipidemia Mother   . Stomach cancer Father   . Hypertension Father   . Hypertension Sister   . Hypertension Brother   . Diabetes Maternal Grandmother   . Diabetes Maternal Grandfather   . Heart disease Paternal Grandmother   . Hypertension Paternal  Grandmother   . Heart disease Paternal Grandfather   . Hyperlipidemia Paternal Grandfather   . Hypertension Paternal Grandfather   . Hypertension Brother     Social History   Tobacco Use  . Smoking status: Former Smoker    Packs/day: 1.00    Years: 12.00    Pack years: 12.00    Types: Cigarettes  . Smokeless tobacco: Never Used  . Tobacco comment: smoke for 12 years- quit at age 16 yrs old   Vaping Use  . Vaping Use: Never used  Substance Use Topics  . Alcohol use: Yes    Alcohol/week: 1.0 standard drink    Types: 1 Glasses of wine per week    Comment: socially   . Drug use: Never    Home Medications Prior to Admission medications   Medication Sig Start Date End Date Taking? Authorizing Provider  atorvastatin (LIPITOR) 10 MG tablet Take 1 tablet (10 mg total) by mouth daily. 10/16/19   Kuneff, Renee A, DO  cephALEXin (KEFLEX) 500 MG capsule Take 1 capsule (500 mg total) by mouth 2 (two) times daily. 02/09/20   Bethann Berkshire, MD  Coenzyme Q10 (CO Q 10 PO)  Take 1 tablet by mouth daily.    [provider]  Cyanocobalamin (VITAMIN B-12 PO) Take by mouth.    [provider]  glipiZIDE (GLUCOTROL) 5 MG tablet Take 0.5 tablets (2.5 mg total) by mouth daily before breakfast. 10/17/19   Kuneff, Renee A, DO  hydrochlorothiazide (HYDRODIURIL) 12.5 MG tablet Take 1 tablet (12.5 mg total) by mouth daily. 10/15/19   Kuneff, Renee A, DO  HYDROcodone-acetaminophen (NORCO/VICODIN) 5-325 MG tablet Take 1 tablet by mouth every 6 (six) hours as needed. 02/09/20   Bethann Berkshire, MD  lisinopril (ZESTRIL) 20 MG tablet Take 1 tablet (20 mg total) by mouth daily. 10/15/19   Kuneff, Renee A, DO  metFORMIN (GLUCOPHAGE) 500 MG tablet Take 1 tablet (500 mg total) by mouth daily with breakfast. Pt will take second 500mg  metformin if needed 10/17/19   Kuneff, Renee A, DO  Multiple Vitamin (MULTIVITAMIN WITH MINERALS) TABS tablet Take 1 tablet by mouth daily.    [provider]  VITAMIN  D PO Take by mouth.    [provider]    Allergies    Patient has no known allergies.  Review of Systems   Review of Systems  Constitutional: Negative for appetite change and fatigue.  HENT: Negative for congestion, ear discharge and sinus pressure.   Eyes: Negative for discharge.  Respiratory: Negative for cough.   Cardiovascular: Negative for chest pain.  Gastrointestinal: Negative for abdominal pain and diarrhea.  Genitourinary: Negative for frequency and hematuria.  Musculoskeletal: Negative for back pain.       Right toenail pain  Skin: Negative for rash.  Neurological: Negative for seizures and headaches.  Psychiatric/Behavioral: Negative for hallucinations.    Physical Exam Updated Vital Signs BP 123/73 (BP Location: Left Arm)   Pulse 74   Temp 97.7 F (36.5 C) (Oral)   Resp 16   Ht 5' 5.5" (1.664 m)   Wt 102.1 kg Comment: foot injury  SpO2 99%   BMI 36.87 kg/m   Physical Exam Constitutional:      Appearance: She is well-developed.  HENT:     Head: Normocephalic.  Eyes:     General: No scleral icterus.    Conjunctiva/sclera: Conjunctivae normal.  Neck:     Thyroid: No thyromegaly.  Cardiovascular:     Rate and Rhythm: Normal rate and regular rhythm.     Heart sounds: No murmur heard.  No friction rub. No gallop.   Pulmonary:     Breath sounds: No stridor. No wheezing or rales.  Chest:     Chest wall: No tenderness.  Abdominal:     General: There is no distension.     Tenderness: There is no abdominal tenderness. There is no rebound.  Musculoskeletal:        General: Normal range of motion.     Cervical back: Neck supple.     Comments: Right toenail barely hanging on with injury underneath the nail  Lymphadenopathy:     Cervical: No cervical adenopathy.  Skin:    Findings: No erythema or rash.  Neurological:     Mental Status: She is oriented to person, place, and time.     Motor: No abnormal muscle tone.     Coordination:  Coordination normal.  Psychiatric:        Behavior: Behavior normal.     ED Results / Procedures / Treatments   Labs (all labs ordered are listed, but only abnormal results are displayed) Labs Reviewed - No data to display  EKG None  Radiology DG Toe Great Right  Result Date: 02/09/2020 CLINICAL DATA:  Pain after trauma EXAM: RIGHT GREAT TOE COMPARISON:  None. FINDINGS: Injury to the patient's toenail identified. No underlying fracture or foreign body. IMPRESSION: No fracture or foreign body. Electronically Signed   By: Dorise Bullion III M.D   On: 02/09/2020 17:49    Procedures Procedures (including critical care time)  Medications Ordered in ED Medications  lidocaine (PF) (XYLOCAINE) 1 % injection 10 mL (10 mLs Infiltration Given 02/09/20 1900)  Tdap (BOOSTRIX) injection 0.5 mL (0.5 mLs Intramuscular Given 02/09/20 1902)    ED Course  I have reviewed the triage vital signs and the nursing notes.  Pertinent labs & imaging results that were available during my care of the patient were reviewed by me and considered in my medical decision making (see chart for details). Toenail removed by my physician assistant   MDM Rules/Calculators/A&P                          Patient toenail was removed and she will follow-up with her podiatrist        This patient presents to the ED for concern of right toe pain this involves an extensive number of treatment options, and is a complaint that carries with it a high risk of complications and morbidity.  The differential diagnosis includes partial avulsion of right toe   Lab Tests:  Medicines ordered:   I ordered medication lidocaine to anesthetize toe  Imaging Studies ordered:   I ordered imaging studies which included x-ray right toe and  I independently visualized and interpreted imaging which showed no fractures  Additional history obtained:   Additional history obtained from records  Previous records obtained and  reviewed.  Consultations Obtained:   Reevaluation:  After the interventions stated above, I reevaluated the patient and found improved  Critical Interventions:  .   Final Clinical Impression(s) / ED Diagnoses Final diagnoses:  Injury of toe on right foot, initial encounter    Rx / DC Orders ED Discharge Orders         Ordered    cephALEXin (KEFLEX) 500 MG capsule  2 times daily     Discontinue  Reprint     02/09/20 1941    HYDROcodone-acetaminophen (NORCO/VICODIN) 5-325 MG tablet  Every 6 hours PRN     Discontinue  Reprint     02/09/20 1943           Milton Ferguson, MD 02/11/20 1212

## 2020-02-09 NOTE — ED Triage Notes (Signed)
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

## 2020-02-11 ENCOUNTER — Ambulatory Visit: Payer: 59 | Admitting: Family Medicine

## 2020-02-12 ENCOUNTER — Ambulatory Visit (INDEPENDENT_AMBULATORY_CARE_PROVIDER_SITE_OTHER): Payer: 59 | Admitting: Family Medicine

## 2020-02-12 ENCOUNTER — Encounter: Payer: Self-pay | Admitting: Family Medicine

## 2020-02-12 ENCOUNTER — Other Ambulatory Visit: Payer: Self-pay

## 2020-02-12 VITALS — BP 123/84 | HR 71 | Temp 97.6°F | Resp 18 | Ht 66.0 in | Wt 228.5 lb

## 2020-02-12 DIAGNOSIS — I10 Essential (primary) hypertension: Secondary | ICD-10-CM

## 2020-02-12 DIAGNOSIS — E1169 Type 2 diabetes mellitus with other specified complication: Secondary | ICD-10-CM | POA: Diagnosis not present

## 2020-02-12 DIAGNOSIS — E785 Hyperlipidemia, unspecified: Secondary | ICD-10-CM

## 2020-02-12 DIAGNOSIS — Z23 Encounter for immunization: Secondary | ICD-10-CM

## 2020-02-12 DIAGNOSIS — E119 Type 2 diabetes mellitus without complications: Secondary | ICD-10-CM | POA: Diagnosis not present

## 2020-02-12 DIAGNOSIS — Z79899 Other long term (current) drug therapy: Secondary | ICD-10-CM | POA: Diagnosis not present

## 2020-02-12 DIAGNOSIS — Z5181 Encounter for therapeutic drug level monitoring: Secondary | ICD-10-CM | POA: Diagnosis not present

## 2020-02-12 DIAGNOSIS — R300 Dysuria: Secondary | ICD-10-CM | POA: Diagnosis not present

## 2020-02-12 LAB — LIPID PANEL
Cholesterol: 107 mg/dL (ref 0–200)
HDL: 36.4 mg/dL — ABNORMAL LOW (ref >39.00)
LDL Cholesterol: 35 mg/dL (ref 0–99)
NonHDL: 70.87
Total CHOL/HDL Ratio: 3
Triglycerides: 180 mg/dL — ABNORMAL HIGH (ref 0.0–149.0)
VLDL: 36 mg/dL (ref 0.0–40.0)

## 2020-02-12 LAB — COMPREHENSIVE METABOLIC PANEL
ALT: 18 U/L (ref 0–35)
AST: 19 U/L (ref 0–37)
Albumin: 4.1 g/dL (ref 3.5–5.2)
Alkaline Phosphatase: 76 U/L (ref 39–117)
BUN: 16 mg/dL (ref 6–23)
CO2: 34 mEq/L — ABNORMAL HIGH (ref 19–32)
Calcium: 9.5 mg/dL (ref 8.4–10.5)
Chloride: 102 mEq/L (ref 96–112)
Creatinine, Ser: 0.76 mg/dL (ref 0.40–1.20)
GFR: 76.95 mL/min (ref >60.00)
Glucose, Bld: 175 mg/dL — ABNORMAL HIGH (ref 70–99)
Potassium: 3.6 mEq/L (ref 3.5–5.1)
Sodium: 142 mEq/L (ref 135–145)
Total Bilirubin: 0.8 mg/dL (ref 0.2–1.2)
Total Protein: 6.5 g/dL (ref 6.0–8.3)

## 2020-02-12 LAB — POCT GLYCOSYLATED HEMOGLOBIN (HGB A1C)
HbA1c POC (<> result, manual entry): 6.1 % (ref 4.0–5.6)
HbA1c, POC (controlled diabetic range): 6.1 % (ref 0.0–7.0)
HbA1c, POC (prediabetic range): 6.1 % (ref 5.7–6.4)
Hemoglobin A1C: 6.1 % — AB (ref 4.0–5.6)

## 2020-02-12 MED ORDER — GLIPIZIDE 5 MG PO TABS: 2.5000 mg | ORAL_TABLET | Freq: Every day | ORAL | 1 refills | Status: DC

## 2020-02-12 MED ORDER — LISINOPRIL 20 MG PO TABS: 20.0000 mg | ORAL_TABLET | Freq: Every day | ORAL | 1 refills | Status: DC

## 2020-02-12 MED ORDER — MUPIROCIN 2 % EX OINT: TOPICAL_OINTMENT | CUTANEOUS | 0 refills | Status: DC

## 2020-02-12 MED ORDER — HYDROCHLOROTHIAZIDE 12.5 MG PO TABS: 12.5000 mg | ORAL_TABLET | Freq: Every day | ORAL | 1 refills | Status: DC

## 2020-02-12 MED ORDER — METFORMIN HCL 500 MG PO TABS: 500.0000 mg | ORAL_TABLET | Freq: Every day | ORAL | 1 refills | Status: DC

## 2020-02-12 NOTE — Patient Instructions (Signed)
a1c 6.1 today... great job.  Continue metformin.

## 2020-02-12 NOTE — Progress Notes (Signed)
This visit occurred during the SARS-CoV-2 public health emergency.  Safety protocols were in place, including screening questions prior to the visit, additional usage of staff PPE, and extensive cleaning of exam room while observing appropriate contact time as indicated for disinfecting solutions.    Patient ID: Donna Russell, female  DOB: November 07, 1955, 64 y.o.   MRN: [id number] Patient Care Team    Relationship Specialty Notifications Start End  Ma Hillock, DO PCP - General Family Medicine  06/12/19   Jovita Kussmaul, MD Consulting Physician General Surgery  06/12/19   Jonnie Kind, MD Consulting Physician Obstetrics and Gynecology  06/12/19   Harlen Labs, MD Referring Physician Optometry  06/12/19     Chief Complaint  Patient presents with  . Diabetes    Needs refills on meds. Pt went to ED for ripping toenail off great L toe. They said to call podiatrist to get in but they do not have openings this week. She is asking for it to be looked at since she cannot get in with foot MD this week,   . Hypertension    Subjective: Donna Russell is a 64 y.o.  Female  present for  Diabetes/obesity: diagnosed early 2020 w/ a1c 7. Pt reports compliance  with metformin 500 mg daily and glipizide 2.5 mg QD.Patient denies dizziness, hyperglycemic or hypoglycemic events. Patient denies numbness, tingling in the extremities or nonhealing wounds of feet.  Unable to  Tolerate higher doses of metformin.   Pt reports BG ranges 120 PNA series: Pneumovax 05/2019.   Flu shot: UTD 2020(recommneded yearly) Microalbumin creatinine ratio 10/15/2019 Foot exam: UTD 05/2019 Eye exam: Dr. Marin Comment, [facility] 01/2019 A1c: 7.0>> 6.5>>7.1> 6.1 today  Hypertension:  Pt reports compliance  with lisinopril 20 mg daily and HCTZ 12.5 mg Qd. Patient denies chest pain, shortness of breath, dizziness or lower extremity edema.  pt does not take a daily baby ASA. Pt is  prescribed  statin- started 3 mos  ago-tolerating.   Dysuria: Pt reports intermittent burning with urination over the last 2 weeks. No fever.   Left large toe/rt 5th toe: Was seen in ED for toenail injury and toenail removed 6/19. She has follow up scheduled with her podiatrist. Right 5th toe possible insect bite. She has been prescribed  kelfelx and had TDap updated in the ED.    RF: Hypertension, obesity, diabetes, family history of heart disease  Depression screen [facility] 2/9 10/15/2019 06/12/2019 08/03/2018  Decreased Interest 0 0 0  Down, Depressed, Hopeless 0 0 0  PHQ - 2 Score 0 0 0   No flowsheet data found.   Immunization History  Administered Date(s) Administered  . Influenza,inj,Quad PF,6+ Mos 06/12/2019  . Pneumococcal Polysaccharide-23 06/12/2019  . Tdap 10/13/2012, 02/09/2020  . Zoster Recombinat (Shingrix) 10/15/2019, 02/12/2020    Past Medical History:  Diagnosis Date  . Chicken pox   . Diabetes mellitus without complication ([place])   . Hypertension   . Osteopenia 2021   No Known Allergies Past Surgical History:  Procedure Laterality Date  . DILATION AND CURETTAGE OF UTERUS    . LAPAROSCOPIC APPENDECTOMY N/A 06/19/2015   Procedure: APPENDECTOMY LAPAROSCOPIC;  Surgeon: Autumn Messing III, MD;  Location: [facility];  Service: General;  Laterality: N/A;   Family History  Problem Relation Age of Onset  . Diabetes Mother   . Hypertension Mother   . Hyperlipidemia Mother   . Stomach cancer Father   . Hypertension Father   .  Hypertension Sister   . Hypertension Brother   . Diabetes Maternal Grandmother   . Diabetes Maternal Grandfather   . Heart disease Paternal Grandmother   . Hypertension Paternal Grandmother   . Heart disease Paternal Grandfather   . Hyperlipidemia Paternal Grandfather   . Hypertension Paternal Grandfather   . Hypertension Brother    Social History   Social History Narrative   Marital status/children/pets: Recently widowed 01/2019.  3 children.   Education/employment: High  school education.   Safety:      -smoke alarm in the home:Yes     - wears seatbelt: Yes       Allergies as of 02/12/2020   No Known Allergies     Medication List       Accurate as of February 12, 2020 11:43 AM. If you have any questions, ask your nurse or doctor.        atorvastatin 10 MG tablet Commonly known as: LIPITOR Take 1 tablet (10 mg total) by mouth daily.   cephALEXin 500 MG capsule Commonly known as: KEFLEX Take 1 capsule (500 mg total) by mouth 2 (two) times daily.   CO Q 10 PO Take 1 tablet by mouth daily.   glipiZIDE 5 MG tablet Commonly known as: GLUCOTROL Take 0.5 tablets (2.5 mg total) by mouth daily before breakfast.   hydrochlorothiazide 12.5 MG tablet Commonly known as: HYDRODIURIL Take 1 tablet (12.5 mg total) by mouth daily.   HYDROcodone-acetaminophen 5-325 MG tablet Commonly known as: NORCO/VICODIN Take 1 tablet by mouth every 6 (six) hours as needed.   lisinopril 20 MG tablet Commonly known as: ZESTRIL Take 1 tablet (20 mg total) by mouth daily.   metFORMIN 500 MG tablet Commonly known as: GLUCOPHAGE Take 1 tablet (500 mg total) by mouth daily with breakfast. Pt will take second 564m metformin if needed   multivitamin with minerals Tabs tablet Take 1 tablet by mouth daily.   mupirocin ointment 2 % Commonly known as: Bactroban Apply twice a day to toe Started by: RHoward Pouch DO   VITAMIN B-12 PO Take by mouth.   VITAMIN D PO Take by mouth.       All past medical history, surgical history, allergies, family history, immunizations andmedications were updated in the EMR today and reviewed under the history and medication portions of their EMR.     Recent Results (from the past 2160 hour(s))  POCT glycosylated hemoglobin (Hb A1C)     Status: Abnormal   Collection Time: 02/12/20 11:01 AM  Result Value Ref Range   Hemoglobin A1C 6.1 (A) 4.0 - 5.6 %   HbA1c POC (<> result, manual entry) 6.1 4.0 - 5.6 %   HbA1c, POC (prediabetic  range) 6.1 5.7 - 6.4 %   HbA1c, POC (controlled diabetic range) 6.1 0.0 - 7.0 %     ROS: 14 pt review of systems performed and negative (unless mentioned in an HPI)  Objective: BP 123/84 (BP Location: Left Arm, Patient Position: Sitting, Cuff Size: Large)   Pulse 71   Temp 97.6 F (36.4 C) (Temporal)   Resp 18   Ht _0  (1.676 m)   Wt 228 lb 8 oz (103.6 kg)   SpO2 97%   BMI 36.88 kg/m  Gen: Afebrile. No acute distress.  HENT: AT. [place].  Eyes:Pupils Equal Round Reactive to light, Extraocular movements intact,  Conjunctiva without redness, discharge or icterus. Neck/lymp/endocrine: Supple,no lymphadenopathy, no thyromegaly CV: RRR no murmur, no edema, +2/4 P posterior tibialis pulses Chest: CTAB,  no wheeze or crackles Skin: no rashes, purpura or petechiae. Left large toenail absent with foil nailbed and 3 sutures. Rt 5th toe with small area of erythema and 2 puncture marks.  Neuro:  Normal gait. PERLA. EOMi. Alert. Psych: Normal affect, dress and demeanor. Normal speech. Normal thought content and judgment.    No exam data present  Assessment/plan: Donna Russell is a 64 y.o. female present for Essential hypertension/morbid obesity - stable.  -Continue lisinopril 20 mg daily - continue HCTZ 12.5 mg daily - continue statin - Lipid panel and CMP collected today - TSH -Follow-up every 4 months with chronic medical conditions  Type 2 diabetes mellitus without complication, without long-term current use of insulin ([place]) - controlled.  - continue Metformin 500 mg QD - continue glip 2.5 qd PNA series: Pneumovax 05/2019.   Flu shot:UTD 2020 (recommneded yearly) Microalbumin creatinine ratio 12/20/2018 within normal limits. Collected today Foot exam: CUTD 05/2019 Eye exam: Dr. Marin Comment, [facility]. 02/20/2019 A1c: 7.0>> 6.5>7.1> 6.1 - f/u 4 mos.   Dysuria: - urine collected today. Will treat if appropriate  Toe injury:  Has podiatry follow up.  Continue  keflex tdap updated inED Start bactroban ointment BID on both large toe and rt 5 th toe.  Monitor for signs of infection.   shingrix #2 provided today  Return in about 4 months (around 06/13/2020).    Orders Placed This Encounter  Procedures  . Varicella-zoster vaccine IM  . Comp Met (CMET)  . Lipid panel  . Urinalysis w microscopic + reflex cultur  . POCT glycosylated hemoglobin (Hb A1C)   Meds ordered this encounter  Medications  . metFORMIN (GLUCOPHAGE) 500 MG tablet    Sig: Take 1 tablet (500 mg total) by mouth daily with breakfast. Pt will take second 553m metformin if needed    Dispense:  90 tablet    Refill:  1    Sorry for the confusion.  Please DC all other Metformin's including the one received yesterday.  Patient unable to tolerate higher doses.  .Marland Kitchenlisinopril (ZESTRIL) 20 MG tablet    Sig: Take 1 tablet (20 mg total) by mouth daily.    Dispense:  90 tablet    Refill:  1  . hydrochlorothiazide (HYDRODIURIL) 12.5 MG tablet    Sig: Take 1 tablet (12.5 mg total) by mouth daily.    Dispense:  90 tablet    Refill:  1  . glipiZIDE (GLUCOTROL) 5 MG tablet    Sig: Take 0.5 tablets (2.5 mg total) by mouth daily before breakfast.    Dispense:  90 tablet    Refill:  1  . mupirocin ointment (BACTROBAN) 2 %    Sig: Apply twice a day to toe    Dispense:  22 g    Refill:  0   Referral Orders  No referral(s) requested today     Electronically signed by: RHoward Pouch DShelbyPSardis

## 2020-02-13 LAB — URINALYSIS W MICROSCOPIC + REFLEX CULTURE
Bacteria, UA: NONE SEEN /HPF
Bilirubin Urine: NEGATIVE
Glucose, UA: NEGATIVE
Hgb urine dipstick: NEGATIVE
Ketones, ur: NEGATIVE
Leukocyte Esterase: NEGATIVE
Nitrites, Initial: NEGATIVE
Protein, ur: NEGATIVE
RBC / HPF: NONE SEEN /HPF (ref 0–2)
Specific Gravity, Urine: 1.018 (ref 1.001–1.03)
pH: <=5 (ref 5.0–8.0)

## 2020-02-13 LAB — NO CULTURE INDICATED

## 2020-07-22 ENCOUNTER — Ambulatory Visit: Payer: 59 | Admitting: Family Medicine

## 2020-07-25 ENCOUNTER — Other Ambulatory Visit: Payer: Self-pay

## 2020-07-25 ENCOUNTER — Encounter: Payer: Self-pay | Admitting: Family Medicine

## 2020-07-25 ENCOUNTER — Ambulatory Visit (INDEPENDENT_AMBULATORY_CARE_PROVIDER_SITE_OTHER): Payer: 59 | Admitting: Family Medicine

## 2020-07-25 VITALS — BP 108/73 | HR 68 | Temp 97.8°F | Ht 66.0 in | Wt 230.0 lb

## 2020-07-25 DIAGNOSIS — E1169 Type 2 diabetes mellitus with other specified complication: Secondary | ICD-10-CM | POA: Diagnosis not present

## 2020-07-25 DIAGNOSIS — Z79899 Other long term (current) drug therapy: Secondary | ICD-10-CM

## 2020-07-25 DIAGNOSIS — E119 Type 2 diabetes mellitus without complications: Secondary | ICD-10-CM | POA: Diagnosis not present

## 2020-07-25 DIAGNOSIS — E785 Hyperlipidemia, unspecified: Secondary | ICD-10-CM

## 2020-07-25 DIAGNOSIS — Z5181 Encounter for therapeutic drug level monitoring: Secondary | ICD-10-CM

## 2020-07-25 DIAGNOSIS — I10 Essential (primary) hypertension: Secondary | ICD-10-CM | POA: Diagnosis not present

## 2020-07-25 DIAGNOSIS — Z23 Encounter for immunization: Secondary | ICD-10-CM

## 2020-07-25 LAB — POCT GLYCOSYLATED HEMOGLOBIN (HGB A1C)
HbA1c POC (<> result, manual entry): 6.1 % (ref 4.0–5.6)
HbA1c, POC (controlled diabetic range): 6.1 % (ref 0.0–7.0)
HbA1c, POC (prediabetic range): 6.1 % (ref 5.7–6.4)
Hemoglobin A1C: 6.1 % — AB (ref 4.0–5.6)

## 2020-07-25 MED ORDER — GLIPIZIDE 5 MG PO TABS: 2.5000 mg | ORAL_TABLET | Freq: Every day | ORAL | 1 refills | Status: DC

## 2020-07-25 MED ORDER — ATORVASTATIN CALCIUM 10 MG PO TABS: 10.0000 mg | ORAL_TABLET | Freq: Every day | ORAL | 3 refills | Status: DC

## 2020-07-25 MED ORDER — METFORMIN HCL 500 MG PO TABS: 500.0000 mg | ORAL_TABLET | Freq: Every day | ORAL | 1 refills | Status: DC

## 2020-07-25 MED ORDER — LISINOPRIL 20 MG PO TABS: 20.0000 mg | ORAL_TABLET | Freq: Every day | ORAL | 1 refills | Status: DC

## 2020-07-25 MED ORDER — HYDROCHLOROTHIAZIDE 12.5 MG PO TABS: 12.5000 mg | ORAL_TABLET | Freq: Every day | ORAL | 1 refills | Status: DC

## 2020-07-25 NOTE — Patient Instructions (Addendum)
Nice to see you today.  A1c looks great at 6.1 BP looks great also.  I have refilled your meds.  See you back in 4 months for chronic conditions.  I recommend you do get your covid shots.    If you are age 63 or older, your body mass index should be between 23-30. Your Body mass index is Body mass index is 37.12 kg/m.Marland Kitchen If this is above the aforementioned range listed, you are consider obese by medical definition and standards. Routine daily exercise and dietary modifications are encouraged. If you would like additional guidance on weight loss, please make an appointment for weight loss counseling - must be an appointment dedicate to weight loss counseling alone. I would be happy to help you.   If you are age 67 or younger, your body mass index should be between 19-25. Your Body mass index is Body mass index is 37.12 kg/m. Marland Kitchen If this is above the aformentioned range listed, you are consider obese by medical definition and standards. Routine daily exercise and dietary modifications are encouraged. If you would like additional guidance on weight loss, please make an appointment for weight loss counseling - must be an appointment dedicate to weight loss counseling alone. I would be happy to help you.

## 2020-07-25 NOTE — Progress Notes (Signed)
This visit occurred during the SARS-CoV-2 public health emergency.  Safety protocols were in place, including screening questions prior to the visit, additional usage of staff PPE, and extensive cleaning of exam room while observing appropriate contact time as indicated for disinfecting solutions.    Patient ID: Donna Russell, female  DOB: Oct 19, 1955, 64 y.o.   MRN: [id number] Patient Care Team    Relationship Specialty Notifications Start End  Natalia Leatherwood, DO PCP - General Family Medicine  06/12/19   Griselda Miner, MD Consulting Physician General Surgery  06/12/19   Tilda Burrow, MD (Inactive) Consulting Physician Obstetrics and Gynecology  06/12/19   Michaelle Copas, MD Referring Physician Optometry  06/12/19     Chief Complaint  Patient presents with  . [facility]    pt is fasting    Subjective: Donna Russell is a 64 y.o.  Female  present for [facility] Diabetes/obesity: diagnosed early 2020 w/ a1c 7. Pt reports compliance  with metformin 500 mg daily and glipizide 2.5 mg QD.Patient denies dizziness, hyperglycemic or hypoglycemic events. Patient denies numbness, tingling in the extremities or nonhealing wounds of feet.    Pt reports BG ranges 132 PNA series: Pneumovax 05/2019.   Flu shot: updated tpday(recommneded yearly) Microalbumin creatinine ratio 10/15/2019 Foot exam: completed today Eye exam: Dr. Conley Rolls, [facility] 01/2019> she will schedule  A1c: 7.0>> 6.5>>7.1> 6.1> 6.1 today  Hypertension:  Pt reports compliance  with lisinopril 20 mg daily and HCTZ 12.5 mg Qd. Patient denies chest pain, shortness of breath, dizziness or lower extremity edema.   pt does not take a daily baby ASA. Pt is  prescribed  statin- started 3 mos ago-tolerating.   RF: Hypertension, obesity, diabetes, family history of heart disease  Depression screen [facility] 2/9 07/25/2020 10/15/2019 06/12/2019 08/03/2018  Decreased Interest 0 0 0 0  Down, Depressed, Hopeless 0 0 0 0  PHQ - 2 Score 0 0 0  0   No flowsheet data found.   Immunization History  Administered Date(s) Administered  . Influenza,inj,Quad PF,6+ Mos 06/12/2019, 07/25/2020  . Pneumococcal Polysaccharide-23 06/12/2019  . Tdap 10/13/2012, 02/09/2020  . Zoster Recombinat (Shingrix) 10/15/2019, 02/12/2020    Past Medical History:  Diagnosis Date  . Chicken pox   . Diabetes mellitus without complication (HCC)   . Hypertension   . Osteopenia 2021   No Known Allergies Past Surgical History:  Procedure Laterality Date  . DILATION AND CURETTAGE OF UTERUS    . LAPAROSCOPIC APPENDECTOMY N/A 06/19/2015   Procedure: APPENDECTOMY LAPAROSCOPIC;  Surgeon: Chevis Pretty III, MD;  Location: [facility];  Service: General;  Laterality: N/A;   Family History  Problem Relation Age of Onset  . Diabetes Mother   . Hypertension Mother   . Hyperlipidemia Mother   . Stomach cancer Father   . Hypertension Father   . Hypertension Sister   . Hypertension Brother   . Diabetes Maternal Grandmother   . Diabetes Maternal Grandfather   . Heart disease Paternal Grandmother   . Hypertension Paternal Grandmother   . Heart disease Paternal Grandfather   . Hyperlipidemia Paternal Grandfather   . Hypertension Paternal Grandfather   . Hypertension Brother    Social History   Social History Narrative   Marital status/children/pets: Recently widowed 01/2019.  3 children.   Education/employment: High school education.   Safety:      -smoke alarm in the home:Yes     - wears seatbelt: Yes  Allergies as of 07/25/2020   No Known Allergies     Medication List       Accurate as of July 25, 2020 10:54 AM. If you have any questions, ask your nurse or doctor.        STOP taking these medications   cephALEXin 500 MG capsule Commonly known as: KEFLEX Stopped by: Felix Pacini, DO   HYDROcodone-acetaminophen 5-325 MG tablet Commonly known as: NORCO/VICODIN Stopped by: Felix Pacini, DO     TAKE these medications   atorvastatin  10 MG tablet Commonly known as: LIPITOR Take 1 tablet (10 mg total) by mouth daily.   CO Q 10 PO Take 1 tablet by mouth daily.   glipiZIDE 5 MG tablet Commonly known as: GLUCOTROL Take 0.5 tablets (2.5 mg total) by mouth daily before breakfast.   hydrochlorothiazide 12.5 MG tablet Commonly known as: HYDRODIURIL Take 1 tablet (12.5 mg total) by mouth daily.   lisinopril 20 MG tablet Commonly known as: ZESTRIL Take 1 tablet (20 mg total) by mouth daily.   metFORMIN 500 MG tablet Commonly known as: GLUCOPHAGE Take 1 tablet (500 mg total) by mouth daily with breakfast. Pt will take second 500mg  metformin if needed   multivitamin with minerals Tabs tablet Take 1 tablet by mouth daily.   mupirocin ointment 2 % Commonly known as: Bactroban Apply twice a day to toe   VITAMIN B-12 PO Take by mouth.   VITAMIN D PO Take by mouth.       All past medical history, surgical history, allergies, family history, immunizations andmedications were updated in the EMR today and reviewed under the history and medication portions of their EMR.     Recent Results (from the past 2160 hour(s))  POCT HgB A1C     Status: Abnormal   Collection Time: 07/25/20 10:15 AM  Result Value Ref Range   Hemoglobin A1C 6.1 (A) 4.0 - 5.6 %   HbA1c POC (<> result, manual entry) 6.1 4.0 - 5.6 %   HbA1c, POC (prediabetic range) 6.1 5.7 - 6.4 %   HbA1c, POC (controlled diabetic range) 6.1 0.0 - 7.0 %     ROS: 14 pt review of systems performed and negative (unless mentioned in an HPI)  Objective: BP 108/73   Pulse 68   Temp 97.8 F (36.6 C) (Oral)   Ht 5\' 6"  (1.676 m)   Wt 230 lb (104.3 kg)   SpO2 98%   BMI 37.12 kg/m  Gen: Afebrile. No acute distress. Nontoxic, pleasant obese female.  HENT: AT. [place]. Eyes:Pupils Equal Round Reactive to light, Extraocular movements intact,  Conjunctiva without redness, discharge or icterus. Neck/lymp/endocrine: Supple,no lymphadenopathy, no thyromegaly CV: RRR no  murmur, no edema, +2/4 P posterior tibialis pulses Chest: CTAB, no wheeze or crackles Skin: no rashes, purpura or petechiae.  Neuro:  Normal gait. PERLA. EOMi. Alert. Oriented x3 Psych: Normal affect, dress and demeanor. Normal speech. Normal thought content and judgment.  Diabetic Foot Exam - Simple   Simple Foot Form Diabetic Foot exam was performed with the following findings: Yes 07/25/2020 10:55 AM  Visual Inspection No deformities, no ulcerations, no other skin breakdown bilaterally: Yes Sensation Testing Intact to touch and monofilament testing bilaterally: Yes Pulse Check Posterior Tibialis and Dorsalis pulse intact bilaterally: Yes Comments      No exam data present  Assessment/plan: Donna Russell is a 64 y.o. female present for Essential hypertension/morbid obesity -stable.  - continue lisinopril 20 mg daily - continue HCTZ 12.5 mg daily -  continue statin -Follow-up every 4 months with chronic medical conditions  Type 2 diabetes mellitus without complication, without long-term current use of insulin (HCC) - stable - continue Metformin 500 mg QD - continue glip 2.5 qd PNA series: Pneumovax 05/2019.   Flu shot: updated tpday(recommneded yearly) Microalbumin creatinine ratio 10/15/2019 Foot exam: completed today Eye exam: Dr. Conley Rolls, [facility] 01/2019> she will schedule  A1c: 7.0>> 6.5>>7.1> 6.1> 6.1 today   Flu shot provided today.  Pt encouraged to consider covid series.   Return in about 4 months (around 11/23/2020) for CMC (30 min).    Orders Placed This Encounter  Procedures  . Flu Vaccine QUAD 6+ mos PF IM (Fluarix Quad PF)  . POCT HgB A1C   Meds ordered this encounter  Medications  . hydrochlorothiazide (HYDRODIURIL) 12.5 MG tablet    Sig: Take 1 tablet (12.5 mg total) by mouth daily.    Dispense:  90 tablet    Refill:  1  . metFORMIN (GLUCOPHAGE) 500 MG tablet    Sig: Take 1 tablet (500 mg total) by mouth daily with breakfast. Pt will  take second 500mg  metformin if needed    Dispense:  90 tablet    Refill:  1    Sorry for the confusion.  Please DC all other Metformin's including the one received yesterday.  Patient unable to tolerate higher doses.  lisinopril (ZESTRIL) 20 MG tablet    Sig: Take 1 tablet (20 mg total) by mouth daily.    Dispense:  90 tablet    Refill:  1  . glipiZIDE (GLUCOTROL) 5 MG tablet    Sig: Take 0.5 tablets (2.5 mg total) by mouth daily before breakfast.    Dispense:  45 tablet    Refill:  1  . atorvastatin (LIPITOR) 10 MG tablet    Sig: Take 1 tablet (10 mg total) by mouth daily.    Dispense:  90 tablet    Refill:  3   Referral Orders  No referral(s) requested today     Electronically signed by: Marland Kitchen, DO [facility]

## 2020-07-31 ENCOUNTER — Telehealth: Payer: Self-pay

## 2020-07-31 NOTE — Telephone Encounter (Addendum)
[facility] Day - Client TELEPHONE ADVICE RECORD AccessNurse Patient Name: Donna Russell Gender: Female DOB: 1956/06/24 Age: 64 Y 1 M 9 D Return Phone Number: [phone] (Primary) [place] [address] Client [facility] Day - Client Client Site [facility] - Day Physician [place], [place] Contact Type Call Who Is Calling Patient / Member / Family / Caregiver Call Type Triage / Clinical Relationship To Patient Self Return Phone Number [phone] (Primary) Chief Complaint Dizziness Reason for Call Symptomatic / Request for Health Information Initial Comment Caller states needs to a triage nurse , she got up, ate and watching tv and she laid back on the couch. She then got really dizzy , sick to stomach Translation No Nurse Assessment Nurse: Alexander Mt, RN, Nicholaus Bloom Date/Time (Eastern Time): 07/30/2020 3:23:39 PM Confirm and document reason for call. If symptomatic, describe symptoms. ---Caller states she got up this morning and felt fine, ate and watching tv and she laid back on the couch. She then got really dizzy and sick to stomach. states she is getting spells of feeling lightheaded and clammy. She is a diabetic but has no way to check sugar. She drank orange juice. Does the patient have any new or worsening symptoms? ---Yes Will a triage be completed? ---Yes Related visit to physician within the last 2 weeks? ---No Does the PT have any chronic conditions? (i.e. diabetes, asthma, this includes High risk factors for pregnancy, etc.) ---Yes List chronic conditions. ---diabetes, cholesterol, HTN Is this a behavioral health or substance abuse call? ---No Guidelines Guideline Title Affirmed Question Affirmed Notes Nurse Date/Time (Eastern Time) Dizziness - Lightheadedness Patient sounds very sick or weak to the triager Alexander Mt, RN, Nicholaus Bloom 07/30/2020 3:26:29 PM Disp. Time Lamount Cohen Time) Disposition  Final User 07/30/2020 3:29:19 PM Go to ED Now (or PCP triage) Yes Alexander Mt, RN, Prentiss Bells Disagree/Comply Comply PLEASE NOTE: All timestamps contained within this report are represented as [place] Standard Time. CONFIDENTIALTY NOTICE: This fax transmission is intended only for the addressee. It contains information that is legally privileged, confidential or otherwise protected from use or disclosure. If you are not the intended recipient, you are strictly prohibited from reviewing, disclosing, copying using or disseminating any of this information or taking any action in reliance on or regarding this information. If you have received this fax in error, please notify us immediately by telephone so that we can arrange for its return to [place]. Phone: [phone], Toll-Free: [phone], Fax: [phone] Page: 2 of 2 Call Id: [id number] Caller Understands Yes PreDisposition Did not know what to do Care Advice Given Per Guideline GO TO ED NOW (OR PCP TRIAGE): * IF NO PCP (PRIMARY CARE PROVIDER) SECOND-LEVEL TRIAGE: You need to be seen within the next hour. Go to the ED/UCC at _____________ Hospital. Leave as soon as you can. ANOTHER ADULT SHOULD DRIVE: * It is better and safer if another adult drives instead of you. CARE ADVICE given per Dizziness (Adult) guideline. Referrals GO TO FACILITY UNDECIDED  Initial Comment Pts blood pressure is currently at 152/93 and is having dizzy spells. Translation No Nurse Assessment Nurse: Ladona Ridgel, RN, Felicia Date/Time (Eastern Time): 07/30/2020 5:14:06 PM Confirm and document reason for call. If symptomatic, describe symptoms. ---Pt has 152/93 BP and she was told to go to ER earlier by triage nurse and she didnt go but she is at [facility] buying a glucometer. She has not been dizzy since she drank OJ. She has to go back in and  get different test strips - denies chest pain and denies other symptoms. She will get the correct test strips for new meter and nurse  will call her back in 10 min. Reached her again and Her blood sugar was 183. She was having dizzy spells but not anymore. No physical symptoms. After she drank juice earlier today she felt better and she was not dizzy. Disp. Time Lamount Cohen Time) Disposition Final User 07/30/2020 4:51:37 PM Send To RN Personal Ladona Ridgel, RN, Felicia PLEASE NOTE: All timestamps contained within this report are represented as [place] Standard Time. CONFIDENTIALTY NOTICE: This fax transmission is intended only for the addressee. It contains information that is legally privileged, confidential or otherwise protected from use or disclosure. If you are not the intended recipient, you are strictly prohibited from reviewing, disclosing, copying using or disseminating any of this information or taking any action in reliance on or regarding this information. If you have received this fax in error, please notify us immediately by telephone so that we can arrange for its return to [place]. Phone: [phone], Toll-Free: [phone], Fax: [phone] Page: 2 of 2 Call Id: [id number] 07/30/2020 5:19:46 PM See PCP within 2 Weeks Yes Ladona Ridgel, RN, Felicia Caller Disagree/Comply Comply Caller Understands Yes PreDisposition InappropriateToAsk Care Advice Given Per Guideline SEE PCP WITHIN 2 WEEKS: * You need to be seen for this ongoing problem within the next 2 weeks. * DECREASE SODIUM INTAKE: Aim to eat less than 2.4 g (100 mmol) of sodium each day. Unfortunately 75% of the salt in the average person's diet is in pre-processed foods. * Weakness or numbness of the face, arm or leg on one side of the body occurs * Difficulty walking, difficulty talking, or severe headache occurs * Chest pain or difficulty breathing occurs * Your blood pressure is over 160/100 * You become worse Comments User: Hampton Abbot, RN Date/Time (Eastern Time): 07/30/2020 4:50:57 PM Pt has 152/93 BP and she was told to go to ER earlier by triage nurse and she didnt  go but she is at [facility] buying a glucometer. She has not been dizzy since she drank OJ. She has to go back in and get different test strips - denies chest pain and denies other symptoms. She will get the correct test strips for new meter and nurse will call her back in 10 min. Referrals REFERRED TO PCP OFFICE

## 2020-07-31 NOTE — Telephone Encounter (Signed)
Spoke with pt who stated sx has resolved. Pt was advise to sched an appt within the couple weeks to f/u. Denied appt. Pt was told to continue to check BS once fasting and again 2 hours after eating and to write it on a log. Pt will go to ED if experience any similar sx or CP, SOB, trouble breathing, or dizzy spells.   FYI

## 2020-10-03 ENCOUNTER — Telehealth: Payer: Self-pay | Admitting: Family Medicine

## 2020-10-03 NOTE — Telephone Encounter (Signed)
Spoke with pt wife providers instructions 

## 2020-10-03 NOTE — Telephone Encounter (Signed)
Patient has diabetes and would like to know if it is okay for her to drink ketones. Please call patient to advise.

## 2020-10-03 NOTE — Telephone Encounter (Signed)
Please advise 

## 2020-10-03 NOTE — Telephone Encounter (Signed)
I can honestly say no but he has ever asked to drink ketones in the past.  I doubt this has ever really been studied for safety.  It  Is probably in her best interest to not drink ketones with her diabetes condition, just to be safe.

## 2020-11-25 ENCOUNTER — Encounter: Payer: Self-pay | Admitting: Family Medicine

## 2020-11-25 ENCOUNTER — Ambulatory Visit (INDEPENDENT_AMBULATORY_CARE_PROVIDER_SITE_OTHER): Payer: 59 | Admitting: Family Medicine

## 2020-11-25 ENCOUNTER — Other Ambulatory Visit: Payer: Self-pay

## 2020-11-25 VITALS — BP 124/72 | HR 71 | Ht 66.0 in | Wt 230.0 lb

## 2020-11-25 DIAGNOSIS — E119 Type 2 diabetes mellitus without complications: Secondary | ICD-10-CM

## 2020-11-25 DIAGNOSIS — I10 Essential (primary) hypertension: Secondary | ICD-10-CM

## 2020-11-25 DIAGNOSIS — M8580 Other specified disorders of bone density and structure, unspecified site: Secondary | ICD-10-CM

## 2020-11-25 DIAGNOSIS — E785 Hyperlipidemia, unspecified: Secondary | ICD-10-CM

## 2020-11-25 DIAGNOSIS — E1169 Type 2 diabetes mellitus with other specified complication: Secondary | ICD-10-CM

## 2020-11-25 DIAGNOSIS — Z79899 Other long term (current) drug therapy: Secondary | ICD-10-CM

## 2020-11-25 DIAGNOSIS — Z5181 Encounter for therapeutic drug level monitoring: Secondary | ICD-10-CM

## 2020-11-25 LAB — POCT GLYCOSYLATED HEMOGLOBIN (HGB A1C)
HbA1c POC (<> result, manual entry): 6.3 % (ref 4.0–5.6)
HbA1c, POC (controlled diabetic range): 6.3 % (ref 0.0–7.0)
HbA1c, POC (prediabetic range): 6.3 % (ref 5.7–6.4)
Hemoglobin A1C: 6.3 % — AB (ref 4.0–5.6)

## 2020-11-25 MED ORDER — GLIPIZIDE 5 MG PO TABS: 2.5000 mg | ORAL_TABLET | Freq: Every day | ORAL | 1 refills | Status: DC

## 2020-11-25 MED ORDER — LISINOPRIL 20 MG PO TABS: 20.0000 mg | ORAL_TABLET | Freq: Every day | ORAL | 1 refills | Status: DC

## 2020-11-25 MED ORDER — HYDROCHLOROTHIAZIDE 12.5 MG PO TABS: 12.5000 mg | ORAL_TABLET | Freq: Every day | ORAL | 1 refills | Status: DC

## 2020-11-25 MED ORDER — METFORMIN HCL 500 MG PO TABS: 500.0000 mg | ORAL_TABLET | Freq: Every day | ORAL | 1 refills | Status: DC

## 2020-11-25 NOTE — Patient Instructions (Signed)
Great to see you today.   a1c 6.3.  Blood pressure looks good.    Next appt in 4 months- schedule as your physical. We will be collecting fasting labs at that appt as well.

## 2020-11-25 NOTE — Progress Notes (Signed)
This visit occurred during the SARS-CoV-2 public health emergency.  Safety protocols were in place, including screening questions prior to the visit, additional usage of staff PPE, and extensive cleaning of exam room while observing appropriate contact time as indicated for disinfecting solutions.    Patient ID: Donna Russell, female  DOB: [birth year], 65 y.o.   MRN: [id number] Patient Care Team    Relationship Specialty Notifications Start End  Natalia Leatherwood, DO PCP - General Family Medicine  06/12/19   Griselda Miner, MD Consulting Physician General Surgery  06/12/19   Tilda Burrow, MD (Inactive) Consulting Physician Obstetrics and Gynecology  06/12/19   Michaelle Copas, MD Referring Physician Optometry  06/12/19     Chief Complaint  Patient presents with  . Follow-up    Cmc; pt is fasting    Subjective: Donna Russell is a 65 y.o.  Female  present for [facility] Diabetes/obesity: diagnosed early 2020 w/ a1c 7. Pt reports compliance  with metformin 500 mg daily and glipizide 2.5 mg QD. Patient denies dizziness, hyperglycemic or hypoglycemic events. Patient denies numbness, tingling in the extremities or nonhealing wounds of feet.   Pt reports BG ranges 132 PNA series: Pneumovax 05/2019.   Flu shot: UTD 2021 (recommneded yearly) Microalbumin creatinine ratio 10/15/2019> collect next appt Foot exam: 07/2020 Eye exam: Dr. Conley Rolls, [facility] 10/2020  A1c: 7.0>> 6.5>>7.1> 6.1> 6.1 > 6.3 today  Hypertension:  Pt reports  Compliance with lisinopril 20 mg daily and HCTZ 12.5 mg Qd. Patient denies chest pain, shortness of breath, dizziness or lower extremity edema.  Pt is prescribed statin.  RF: Hypertension, obesity, diabetes, family history of heart disease  Depression screen [facility] 2/9 07/25/2020 10/15/2019 06/12/2019 08/03/2018  Decreased Interest 0 0 0 0  Down, Depressed, Hopeless 0 0 0 0  PHQ - 2 Score 0 0 0 0   No flowsheet data found.   Immunization History   Administered Date(s) Administered  . Influenza,inj,Quad PF,6+ Mos 06/12/2019, 07/25/2020  . Moderna Sars-Covid-2 Vaccination 09/03/2020  . Pneumococcal Polysaccharide-23 06/12/2019  . Tdap 10/13/2012, 02/09/2020  . Zoster Recombinat (Shingrix) 10/15/2019, 02/12/2020    Past Medical History:  Diagnosis Date  . Chicken pox   . Diabetes mellitus without complication (HCC)   . Hypertension   . Osteopenia 2021   No Known Allergies Past Surgical History:  Procedure Laterality Date  . DILATION AND CURETTAGE OF UTERUS    . LAPAROSCOPIC APPENDECTOMY N/A 06/19/2015   Procedure: APPENDECTOMY LAPAROSCOPIC;  Surgeon: Chevis Pretty III, MD;  Location: [facility];  Service: General;  Laterality: N/A;   Family History  Problem Relation Age of Onset  . Diabetes Mother   . Hypertension Mother   . Hyperlipidemia Mother   . Stomach cancer Father   . Hypertension Father   . Hypertension Sister   . Hypertension Brother   . Diabetes Maternal Grandmother   . Diabetes Maternal Grandfather   . Heart disease Paternal Grandmother   . Hypertension Paternal Grandmother   . Heart disease Paternal Grandfather   . Hyperlipidemia Paternal Grandfather   . Hypertension Paternal Grandfather   . Hypertension Brother    Social History   Social History Narrative   Marital status/children/pets: Recently widowed 01/2019.  3 children.   Education/employment: High school education.   Safety:      -smoke alarm in the home:Yes     - wears seatbelt: Yes       Allergies as of 11/25/2020  No Known Allergies     Medication List       Accurate as of November 25, 2020 10:24 AM. If you have any questions, ask your nurse or doctor.        atorvastatin 10 MG tablet Commonly known as: LIPITOR Take 1 tablet (10 mg total) by mouth daily.   CO Q 10 PO Take 1 tablet by mouth daily.   glipiZIDE 5 MG tablet Commonly known as: GLUCOTROL Take 0.5 tablets (2.5 mg total) by mouth daily before breakfast.    hydrochlorothiazide 12.5 MG tablet Commonly known as: HYDRODIURIL Take 1 tablet (12.5 mg total) by mouth daily.   lisinopril 20 MG tablet Commonly known as: ZESTRIL Take 1 tablet (20 mg total) by mouth daily.   metFORMIN 500 MG tablet Commonly known as: GLUCOPHAGE Take 1 tablet (500 mg total) by mouth daily with breakfast. Pt will take second 500mg  metformin if needed   multivitamin with minerals Tabs tablet Take 1 tablet by mouth daily.   mupirocin ointment 2 % Commonly known as: Bactroban Apply twice a day to toe   VITAMIN B-12 PO Take by mouth.   VITAMIN D PO Take by mouth.       All past medical history, surgical history, allergies, family history, immunizations andmedications were updated in the EMR today and reviewed under the history and medication portions of their EMR.     Recent Results (from the past 2160 hour(s))  POCT HgB A1C     Status: Abnormal   Collection Time: 11/25/20 10:13 AM  Result Value Ref Range   Hemoglobin A1C 6.3 (A) 4.0 - 5.6 %   HbA1c POC (<> result, manual entry) 6.3 4.0 - 5.6 %   HbA1c, POC (prediabetic range) 6.3 5.7 - 6.4 %   HbA1c, POC (controlled diabetic range) 6.3 0.0 - 7.0 %     ROS: 14 pt review of systems performed and negative (unless mentioned in an HPI)  Objective: BP 124/72   Pulse 71   Ht 5\' 6"  (1.676 m)   Wt 230 lb (104.3 kg)   SpO2 98%   BMI 37.12 kg/m  Gen: Afebrile. No acute distress. Nontoxic, pleasant obese female.  HENT: AT. [place]. MMM Eyes:Pupils Equal Round Reactive to light, Extraocular movements intact,  Conjunctiva without redness, discharge or icterus. CV: RRR no murmur, no edema, +2/4 P posterior tibialis pulses Chest: CTAB, no wheeze or crackles Abd: Soft. NTND. BS present. no Masses palpated.  Neuro: Normal gait. PERLA. EOMi. Alert. Orientedx3 Psych: Normal affect, dress and demeanor. Normal speech. Normal thought content and judgment.   No exam data present  Assessment/plan: Donna Russell is  a 65 y.o. female present for Essential hypertension/morbid obesity - stable.  - continue  lisinopril 20 mg daily - continue  HCTZ 12.5 mg daily - continue  statin -Follow-up every 4 months with chronic medical conditions  Type 2 diabetes mellitus without complication, without long-term current use of insulin (HCC) - stable.  - continue  Metformin 500 mg QD - continue  glip 2.5 qd PNA series: Pneumovax 05/2019.   Flu shot: updated tpday(recommneded yearly) Microalbumin creatinine ratio 10/15/2019> collect next appt Foot exam: 07/2020 Eye exam: Dr. 10/17/2019, [facility] 10/2020. reocrds requested A1c: 7.0>> 6.5>>7.1> 6.1> 6.1>6.3 today   No follow-ups on file.    Orders Placed This Encounter  Procedures  . POCT HgB A1C   No orders of the defined types were placed in this encounter.  Referral Orders  No referral(s)  requested today     Electronically signed by: Howard Pouch, DO [place]

## 2020-12-04 ENCOUNTER — Other Ambulatory Visit (HOSPITAL_COMMUNITY): Payer: Self-pay | Admitting: Family Medicine

## 2020-12-04 DIAGNOSIS — Z1231 Encounter for screening mammogram for malignant neoplasm of breast: Secondary | ICD-10-CM

## 2020-12-11 ENCOUNTER — Ambulatory Visit (HOSPITAL_COMMUNITY)
Admission: RE | Admit: 2020-12-11 | Discharge: 2020-12-11 | Disposition: A | Discharge status: Home / Self Care | Payer: 59 | Source: Ambulatory Visit | Attending: Family Medicine | Admitting: Family Medicine

## 2020-12-11 ENCOUNTER — Other Ambulatory Visit: Payer: Self-pay

## 2020-12-11 DIAGNOSIS — Z1231 Encounter for screening mammogram for malignant neoplasm of breast: Secondary | ICD-10-CM | POA: Insufficient documentation

## 2021-02-09 ENCOUNTER — Telehealth: Payer: Self-pay | Admitting: Family Medicine

## 2021-02-09 NOTE — Telephone Encounter (Signed)
Pt sched for appt 6/24

## 2021-02-09 NOTE — Telephone Encounter (Signed)
Pt called requesting call back from nurse about a horse fly bite on left leg that she has had since last Monday.

## 2021-02-13 ENCOUNTER — Ambulatory Visit: Payer: 59 | Admitting: Family Medicine

## 2021-03-27 ENCOUNTER — Encounter: Payer: 59 | Admitting: Family Medicine

## 2021-05-12 ENCOUNTER — Other Ambulatory Visit: Payer: Self-pay

## 2021-05-12 ENCOUNTER — Encounter: Payer: Self-pay | Admitting: Family Medicine

## 2021-05-12 ENCOUNTER — Ambulatory Visit (INDEPENDENT_AMBULATORY_CARE_PROVIDER_SITE_OTHER): Payer: 59 | Admitting: Family Medicine

## 2021-05-12 VITALS — BP 99/67 | HR 75 | Temp 98.0°F | Ht 65.25 in | Wt 232.0 lb

## 2021-05-12 DIAGNOSIS — Z23 Encounter for immunization: Secondary | ICD-10-CM | POA: Diagnosis not present

## 2021-05-12 DIAGNOSIS — Z1231 Encounter for screening mammogram for malignant neoplasm of breast: Secondary | ICD-10-CM | POA: Diagnosis not present

## 2021-05-12 DIAGNOSIS — E785 Hyperlipidemia, unspecified: Secondary | ICD-10-CM

## 2021-05-12 DIAGNOSIS — Z0000 Encounter for general adult medical examination without abnormal findings: Secondary | ICD-10-CM | POA: Diagnosis not present

## 2021-05-12 DIAGNOSIS — M8580 Other specified disorders of bone density and structure, unspecified site: Secondary | ICD-10-CM | POA: Diagnosis not present

## 2021-05-12 DIAGNOSIS — Z5181 Encounter for therapeutic drug level monitoring: Secondary | ICD-10-CM | POA: Diagnosis not present

## 2021-05-12 DIAGNOSIS — E1169 Type 2 diabetes mellitus with other specified complication: Secondary | ICD-10-CM | POA: Diagnosis not present

## 2021-05-12 DIAGNOSIS — I10 Essential (primary) hypertension: Secondary | ICD-10-CM

## 2021-05-12 DIAGNOSIS — Z79899 Other long term (current) drug therapy: Secondary | ICD-10-CM

## 2021-05-12 LAB — HEMOGLOBIN A1C: Hgb A1c MFr Bld: 6.7 % — ABNORMAL HIGH (ref 4.6–6.5)

## 2021-05-12 LAB — TSH: TSH: 3.18 u[IU]/mL (ref 0.35–5.50)

## 2021-05-12 LAB — VITAMIN D 25 HYDROXY (VIT D DEFICIENCY, FRACTURES): VITD: 29.63 ng/mL — ABNORMAL LOW (ref 30.00–100.00)

## 2021-05-12 MED ORDER — METFORMIN HCL 500 MG PO TABS: 500.0000 mg | ORAL_TABLET | Freq: Every day | ORAL | 1 refills | Status: DC

## 2021-05-12 MED ORDER — ATORVASTATIN CALCIUM 10 MG PO TABS: 10.0000 mg | ORAL_TABLET | Freq: Every day | ORAL | 3 refills | Status: DC

## 2021-05-12 MED ORDER — LISINOPRIL 20 MG PO TABS: 20.0000 mg | ORAL_TABLET | Freq: Every day | ORAL | 1 refills | Status: DC

## 2021-05-12 MED ORDER — GLIPIZIDE 5 MG PO TABS: 2.5000 mg | ORAL_TABLET | Freq: Every day | ORAL | 1 refills | Status: DC

## 2021-05-12 NOTE — Patient Instructions (Signed)
Great to see you today.  I have refilled the medication(s) we provide.   If labs were collected, we will inform you of lab results once received either by echart message or telephone call.   - echart message- for normal results that have been seen by the patient already.   - telephone call: abnormal results or if patient has not viewed results in their echart. Health Maintenance, Female Adopting a healthy lifestyle and getting preventive care are important in promoting health and wellness. Ask your health care provider about: The right schedule for you to have regular tests and exams. Things you can do on your own to prevent diseases and keep yourself healthy. What should I know about diet, weight, and exercise? Eat a healthy diet  Eat a diet that includes plenty of vegetables, fruits, low-fat dairy products, and lean protein. Do not eat a lot of foods that are high in solid fats, added sugars, or sodium. Maintain a healthy weight Body mass index (BMI) is used to identify weight problems. It estimates body fat based on height and weight. Your health care provider can help determine your BMI and help you achieve or maintain a healthy weight. Get regular exercise Get regular exercise. This is one of the most important things you can do for your health. Most adults should: Exercise for at least 150 minutes each week. The exercise should increase your heart rate and make you sweat (moderate-intensity exercise). Do strengthening exercises at least twice a week. This is in addition to the moderate-intensity exercise. Spend less time sitting. Even light physical activity can be beneficial. Watch cholesterol and blood lipids Have your blood tested for lipids and cholesterol at 64 years of age, then have this test every 5 years. Have your cholesterol levels checked more often if: Your lipid or cholesterol levels are high. You are older than 64 years of age. You are at high risk for heart  disease. What should I know about cancer screening? Depending on your health history and family history, you may need to have cancer screening at various ages. This may include screening for: Breast cancer. Cervical cancer. Colorectal cancer. Skin cancer. Lung cancer. What should I know about heart disease, diabetes, and high blood pressure? Blood pressure and heart disease High blood pressure causes heart disease and increases the risk of stroke. This is more likely to develop in people who have high blood pressure readings, are of African descent, or are overweight. Have your blood pressure checked: Every 3-5 years if you are 18-39 years of age. Every year if you are 40 years old or older. Diabetes Have regular diabetes screenings. This checks your fasting blood sugar level. Have the screening done: Once every three years after age 40 if you are at a normal weight and have a low risk for diabetes. More often and at a younger age if you are overweight or have a high risk for diabetes. What should I know about preventing infection? Hepatitis B If you have a higher risk for hepatitis B, you should be screened for this virus. Talk with your health care provider to find out if you are at risk for hepatitis B infection. Hepatitis C Testing is recommended for: Everyone born from 1945 through 1965. Anyone with known risk factors for hepatitis C. Sexually transmitted infections (STIs) Get screened for STIs, including gonorrhea and chlamydia, if: You are sexually active and are younger than 64 years of age. You are older than 64 years of age and your   health care provider tells you that you are at risk for this type of infection. Your sexual activity has changed since you were last screened, and you are at increased risk for chlamydia or gonorrhea. Ask your health care provider if you are at risk. Ask your health care provider about whether you are at high risk for HIV. Your health care provider  may recommend a prescription medicine to help prevent HIV infection. If you choose to take medicine to prevent HIV, you should first get tested for HIV. You should then be tested every 3 months for as long as you are taking the medicine. Pregnancy If you are about to stop having your period (premenopausal) and you may become pregnant, seek counseling before you get pregnant. Take 400 to 800 micrograms (mcg) of folic acid every day if you become pregnant. Ask for birth control (contraception) if you want to prevent pregnancy. Osteoporosis and menopause Osteoporosis is a disease in which the bones lose minerals and strength with aging. This can result in bone fractures. If you are 65 years old or older, or if you are at risk for osteoporosis and fractures, ask your health care provider if you should: Be screened for bone loss. Take a calcium or vitamin D supplement to lower your risk of fractures. Be given hormone replacement therapy (HRT) to treat symptoms of menopause. Follow these instructions at home: Lifestyle Do not use any products that contain nicotine or tobacco, such as cigarettes, e-cigarettes, and chewing tobacco. If you need help quitting, ask your health care provider. Do not use street drugs. Do not share needles. Ask your health care provider for help if you need support or information about quitting drugs. Alcohol use Do not drink alcohol if: Your health care provider tells you not to drink. You are pregnant, may be pregnant, or are planning to become pregnant. If you drink alcohol: Limit how much you use to 0-1 drink a day. Limit intake if you are breastfeeding. Be aware of how much alcohol is in your drink. In the [place] one drink equals one 12 oz bottle of beer (355 mL), one 5 oz glass of wine (148 mL), or one 1 oz glass of hard liquor (44 mL). General instructions Schedule regular health, dental, and eye exams. Stay current with your vaccines. Tell your health care  provider if: You often feel depressed. You have ever been abused or do not feel safe at home. Summary Adopting a healthy lifestyle and getting preventive care are important in promoting health and wellness. Follow your health care provider's instructions about healthy diet, exercising, and getting tested or screened for diseases. Follow your health care provider's instructions on monitoring your cholesterol and blood pressure. This information is not intended to replace advice given to you by your health care provider. Make sure you discuss any questions you have with your health care provider. Document Revised: 10/17/2020 Document Reviewed: 08/02/2018 Elsevier Patient Education  2022 [employer] Inc.  

## 2021-05-12 NOTE — Progress Notes (Signed)
This visit occurred during the SARS-CoV-2 public health emergency.  Safety protocols were in place, including screening questions prior to the visit, additional usage of staff PPE, and extensive cleaning of exam room while observing appropriate contact time as indicated for disinfecting solutions.    Patient ID: Donna Russell, female  DOB: 08-27-1955, 65 y.o.   MRN: [id number] Patient Care Team    Relationship Specialty Notifications Start End  Natalia Leatherwood, DO PCP - General Family Medicine  06/12/19   Griselda Miner, MD Consulting Physician General Surgery  06/12/19   Tilda Burrow, MD (Inactive) Consulting Physician Obstetrics and Gynecology  06/12/19   Michaelle Copas, MD Referring Physician Optometry  06/12/19     Chief Complaint  Patient presents with   Annual Exam    Pt is fasting    Subjective: Donna Russell is a 65 y.o.  Female  present for CPE/CMC. All past medical history, surgical history, allergies, family history, immunizations, medications and social history were updated in the electronic medical record today. All recent labs, ED visits and hospitalizations within the last year were reviewed.  Health maintenance:  Colonoscopy: cologuard 10/25/2019> next screen 10/2022 Mammogram: completed 11/2020 at [facility]. Ordered today for 2023 Cervical cancer screening: completed today with co-test.  Immunizations: tdap UTD 2014, Influenza utd 2022- today (encouraged yearly), PNA23 05/2019- rpt after 65yo. Shingrix  completed. Covid counseled.   Infectious disease screening: HIV declined, Hep C completed  DEXA: 10/22/2019 -1.8, osteopenia Assistive device: none Oxygen GQQ:PYPP Patient has a Dental home. Hospitalizations/ED visits: reviewed  Diabetes/obesity: diagnosed early 2020 w/ a1c 7. Pt reports compliance  with metformin 500 mg daily and glipizide 2.5 mg QD. Patient denies dizziness, hyperglycemic or hypoglycemic events. Patient denies numbness, tingling in the  extremities or nonhealing wounds of feet.   Pt reports BG ranges wnl PNA series: Pneumovax 05/2019.     Hypertension:  Pt reports  compliance  with lisinopril 20 mg daily and HCTZ 12.5 mg Qd. Patient denies chest pain, shortness of breath, dizziness or lower extremity edema.  Pt is prescribed statin.  RF: Hypertension, obesity, diabetes, family history of heart disease  Depression screen [facility] 2/9 05/12/2021 07/25/2020 10/15/2019 06/12/2019 08/03/2018  Decreased Interest 0 0 0 0 0  Down, Depressed, Hopeless 0 0 0 0 0  PHQ - 2 Score 0 0 0 0 0   No flowsheet data found.   Immunization History  Administered Date(s) Administered   Influenza,inj,Quad PF,6+ Mos 06/12/2019, 07/25/2020, 05/12/2021   Moderna Sars-Covid-2 Vaccination 09/03/2020   Pneumococcal Polysaccharide-23 06/12/2019   Tdap 10/13/2012, 02/09/2020   Zoster Recombinat (Shingrix) 10/15/2019, 02/12/2020    Past Medical History:  Diagnosis Date   Chicken pox    Diabetes mellitus without complication (HCC)    Hypertension    Osteopenia 2021   No Known Allergies Past Surgical History:  Procedure Laterality Date   DILATION AND CURETTAGE OF UTERUS     LAPAROSCOPIC APPENDECTOMY N/A 06/19/2015   Procedure: APPENDECTOMY LAPAROSCOPIC;  Surgeon: Chevis Pretty III, MD;  Location: [facility];  Service: General;  Laterality: N/A;   Family History  Problem Relation Age of Onset   Diabetes Mother    Hypertension Mother    Hyperlipidemia Mother    Stomach cancer Father    Hypertension Father    Hypertension Sister    Hypertension Brother    Diabetes Maternal Grandmother    Diabetes Maternal Grandfather    Heart disease Paternal Grandmother    Hypertension Paternal  Grandmother    Heart disease Paternal Grandfather    Hyperlipidemia Paternal Grandfather    Hypertension Paternal Grandfather    Hypertension Brother    Social History   Social History Narrative   Marital status/children/pets: Recently widowed 01/2019.  3 children.    Education/employment: High school education.   Safety:      -smoke alarm in the home:Yes     - wears seatbelt: Yes       Allergies as of 05/12/2021   No Known Allergies      Medication List        Accurate as of May 12, 2021 10:01 AM. If you have any questions, ask your nurse or doctor.          STOP taking these medications    mupirocin ointment 2 % Commonly known as: Bactroban Stopped by: Felix Pacini, DO       TAKE these medications    atorvastatin 10 MG tablet Commonly known as: LIPITOR Take 1 tablet (10 mg total) by mouth daily.   CO Q 10 PO Take 1 tablet by mouth daily.   glipiZIDE 5 MG tablet Commonly known as: GLUCOTROL Take 0.5 tablets (2.5 mg total) by mouth daily before breakfast.   hydrochlorothiazide 12.5 MG tablet Commonly known as: HYDRODIURIL Take 1 tablet (12.5 mg total) by mouth daily.   lisinopril 20 MG tablet Commonly known as: ZESTRIL Take 1 tablet (20 mg total) by mouth daily.   metFORMIN 500 MG tablet Commonly known as: GLUCOPHAGE Take 1 tablet (500 mg total) by mouth daily with breakfast.   multivitamin with minerals Tabs tablet Take 1 tablet by mouth daily.   VITAMIN B-12 PO Take by mouth.   VITAMIN D PO Take by mouth.        All past medical history, surgical history, allergies, family history, immunizations andmedications were updated in the EMR today and reviewed under the history and medication portions of their EMR.     No results found for this or any previous visit (from the past 2160 hour(s)).  MM 3D SCREEN BREAST BILATERAL  Result Date: 12/11/2020 CLINICAL DATA:  Screening. EXAM: DIGITAL SCREENING BILATERAL MAMMOGRAM WITH TOMOSYNTHESIS AND CAD TECHNIQUE: Bilateral screening digital craniocaudal and mediolateral oblique mammograms were obtained. Bilateral screening digital breast tomosynthesis was performed. The images were evaluated with computer-aided detection. COMPARISON:  Previous exam(s). ACR Breast  Density Category b: There are scattered areas of fibroglandular density. FINDINGS: There are no findings suspicious for malignancy. The images were evaluated with computer-aided detection. IMPRESSION: No mammographic evidence of malignancy. A result letter of this screening mammogram will be mailed directly to the patient. RECOMMENDATION: Screening mammogram in one year. (Code:SM-B-01Y) BI-RADS CATEGORY  1: Negative. Electronically Signed   By: Annia Belt M.D.   On: 12/11/2020 13:11     ROS: 14 pt review of systems performed and negative (unless mentioned in an HPI)  Objective: BP 99/67   Pulse 75   Temp 98 F (36.7 C) (Oral)   Ht 5' 5.25" (1.657 m)   Wt 232 lb (105.2 kg)   SpO2 98%   BMI 38.31 kg/m  Gen: Afebrile. No acute distress. Nontoxic in appearance, well-developed, well-nourished,  obese female. Pleasant.  HENT: AT. [place]. Bilateral TM visualized and normal in appearance, normal external auditory canal. MMM, no oral lesions, adequate dentition. Bilateral nares within normal limits. Throat without erythema, ulcerations or exudates. no Cough on exam, no hoarseness on exam. Eyes:Pupils Equal Round Reactive to light, Extraocular movements intact,  Conjunctiva without redness, discharge or icterus. Neck/lymp/endocrine: Supple,no lymphadenopathy, no thyromegaly CV: RRR no murmur, no edema, +2/4 P posterior tibialis pulses.  Chest: CTAB, no wheeze, rhonchi or crackles. normal Respiratory effort. good Air movement. Abd: Soft. obese. NTND. BS present. no Masses palpated. No hepatosplenomegaly. No rebound tenderness or guarding. Skin: no rashes, purpura or petechiae. Warm and well-perfused. Skin intact. Neuro/Msk: Normal gait. PERLA. EOMi. Alert. Oriented x3.  Cranial nerves II through XII intact. Muscle strength 5/5 upper/lower extremity. DTRs equal bilaterally. Psych: Normal affect, dress and demeanor. Normal speech. Normal thought content and judgment.   No results  found.  Assessment/plan: Donna Russell is a 65 y.o. female present for CPE/CMC Essential hypertension/morbid obesity - stable. -continue  lisinopril 20 mg daily - continue  HCTZ 12.5 mg daily - continue statin -Follow-up every 4 months with chronic medical conditions   Type 2 diabetes mellitus with hyperlipidemia (HCC) - has been stable.  - continue  Metformin 500 mg QD - continue glip 2.5 qd PNA series: Pneumovax 05/2019.   Flu shot: updated today(recommneded yearly) Microalbumin creatinine ratio 10/15/2019> collect today Foot exam: 05/12/2021 Eye exam: Dr. Conley Rolls, [facility] 10/2020. reocrds requested A1c: 7.0>> 6.5>>7.1> 6.1> 6.1>6.3> collected today   Influenza vaccination administered at current visit  Osteopenia, unspecified location - VITAMIN D 25 Hydroxy (Vit-D Deficiency, Fractures)  Breast cancer screening by mammogram - MM 3D SCREEN BREAST BILATERAL; Future  Routine general medical examination at a health care facility Colonoscopy: cologuard 10/25/2019> next screen 10/2022 Mammogram: completed 11/2020 at [facility]. Ordered today for 2023 Cervical cancer screening: completed today with co-test.  Immunizations: tdap UTD 2014, Influenza utd 2022- today (encouraged yearly), PNA23 05/2019- rpt after 65yo. Shingrix  completed. Covid counseled.   Infectious disease screening: HIV declined, Hep C completed  DEXA: 10/22/2019 -1.8, osteopenia Patient was encouraged to exercise greater than 150 minutes a week. Patient was encouraged to choose a diet filled with fresh fruits and vegetables, and lean meats. AVS provided to patient today for education/recommendation on gender specific health and safety maintenance.  Return in about 5 months (around 10/26/2021) for CMC (30 min).   Orders Placed This Encounter  Procedures   MM 3D SCREEN BREAST BILATERAL   Flu Vaccine QUAD 6+ mos PF IM (Fluarix Quad PF)   CBC with Differential/Platelet   Comprehensive metabolic panel   Hemoglobin  A1c   TSH   Lipid panel   VITAMIN D 25 Hydroxy (Vit-D Deficiency, Fractures)   Meds ordered this encounter  Medications   atorvastatin (LIPITOR) 10 MG tablet    Sig: Take 1 tablet (10 mg total) by mouth daily.    Dispense:  90 tablet    Refill:  3   glipiZIDE (GLUCOTROL) 5 MG tablet    Sig: Take 0.5 tablets (2.5 mg total) by mouth daily before breakfast.    Dispense:  45 tablet    Refill:  1   lisinopril (ZESTRIL) 20 MG tablet    Sig: Take 1 tablet (20 mg total) by mouth daily.    Dispense:  90 tablet    Refill:  1   metFORMIN (GLUCOPHAGE) 500 MG tablet    Sig: Take 1 tablet (500 mg total) by mouth daily with breakfast.    Dispense:  90 tablet    Refill:  1   Referral Orders  No referral(s) requested today     Electronically signed by: Felix Pacini, DO  [facility]

## 2021-05-13 ENCOUNTER — Encounter: Payer: Self-pay | Admitting: Family Medicine

## 2021-05-13 ENCOUNTER — Telehealth: Payer: Self-pay

## 2021-05-13 LAB — LIPID PANEL
Cholesterol: 120 mg/dL (ref 0–200)
HDL: 41.1 mg/dL (ref >39.00)
LDL Cholesterol: 51 mg/dL (ref 0–99)
NonHDL: 78.98
Total CHOL/HDL Ratio: 3
Triglycerides: 142 mg/dL (ref 0.0–149.0)
VLDL: 28.4 mg/dL (ref 0.0–40.0)

## 2021-05-13 LAB — CBC WITH DIFFERENTIAL/PLATELET
Basophils Absolute: 0.1 10*3/uL (ref 0.0–0.1)
Basophils Relative: 1.1 % (ref 0.0–3.0)
Eosinophils Absolute: 0 10*3/uL (ref 0.0–0.7)
Eosinophils Relative: 0.8 % (ref 0.0–5.0)
HCT: 43 % (ref 36.0–46.0)
Hemoglobin: 14.3 g/dL (ref 12.0–15.0)
Lymphocytes Relative: 37.5 % (ref 12.0–46.0)
Lymphs Abs: 2.1 10*3/uL (ref 0.7–4.0)
MCHC: 33.3 g/dL (ref 30.0–36.0)
MCV: 89.4 fl (ref 78.0–100.0)
Monocytes Absolute: 0.3 10*3/uL (ref 0.1–1.0)
Monocytes Relative: 5.6 % (ref 3.0–12.0)
Neutro Abs: 3.1 10*3/uL (ref 1.4–7.7)
Neutrophils Relative %: 55 % (ref 43.0–77.0)
Platelets: 265 10*3/uL (ref 150.0–400.0)
RBC: 4.81 Mil/uL (ref 3.87–5.11)
RDW: 13.3 % (ref 11.5–15.5)
WBC: 5.6 10*3/uL (ref 4.0–10.5)

## 2021-05-13 LAB — COMPREHENSIVE METABOLIC PANEL
ALT: 20 U/L (ref 0–35)
AST: 20 U/L (ref 0–37)
Albumin: 4.1 g/dL (ref 3.5–5.2)
Alkaline Phosphatase: 71 U/L (ref 39–117)
BUN: 12 mg/dL (ref 6–23)
CO2: 27 mEq/L (ref 19–32)
Calcium: 9.5 mg/dL (ref 8.4–10.5)
Chloride: 103 mEq/L (ref 96–112)
Creatinine, Ser: 0.84 mg/dL (ref 0.40–1.20)
GFR: 73.71 mL/min (ref >60.00)
Glucose, Bld: 132 mg/dL — ABNORMAL HIGH (ref 70–99)
Potassium: 3.7 mEq/L (ref 3.5–5.1)
Sodium: 142 mEq/L (ref 135–145)
Total Bilirubin: 0.9 mg/dL (ref 0.2–1.2)
Total Protein: 6.9 g/dL (ref 6.0–8.3)

## 2021-05-13 MED ORDER — DEBROX 6.5 % OT SOLN
5.0000 [drp] | Freq: Two times a day (BID) | OTIC | 2 refills | Status: DC | PRN
Start: 2021-05-13 — End: 2022-06-24

## 2021-05-13 NOTE — Telephone Encounter (Signed)
Please advise 

## 2021-05-13 NOTE — Telephone Encounter (Signed)
LVM for pt to CB regarding results.  Note: See results

## 2021-05-13 NOTE — Telephone Encounter (Signed)
I have prescribed Debrox otic solution.  There is also similar products over-the-counter if insurance does not cover.

## 2021-05-13 NOTE — Telephone Encounter (Signed)
Patient is calling in stating that she was here yesterday and was told Dr.Kuneff would prescribe something to help with the build up in her ears, wanted to know if it was OTC or prescribed,

## 2021-05-13 NOTE — Telephone Encounter (Signed)
LVM for pt to CB regarding results and ear drops.

## 2021-05-14 NOTE — Telephone Encounter (Signed)
LVM for pt to CB regarding results and ear drops.  

## 2021-05-15 NOTE — Telephone Encounter (Signed)
LM for pt to return call to discuss.  

## 2021-06-04 ENCOUNTER — Telehealth: Payer: Self-pay | Admitting: Family Medicine

## 2021-06-04 NOTE — Telephone Encounter (Signed)
Noted  

## 2021-06-04 NOTE — Telephone Encounter (Signed)
FYI

## 2021-06-04 NOTE — Telephone Encounter (Signed)
Pt called and said she was told by [facility] to let us know that she is using them for diabetic supplies now

## 2021-10-05 ENCOUNTER — Other Ambulatory Visit: Payer: Self-pay | Admitting: Family Medicine

## 2021-10-22 ENCOUNTER — Other Ambulatory Visit: Payer: Self-pay

## 2021-10-22 ENCOUNTER — Encounter: Payer: Self-pay | Admitting: Family Medicine

## 2021-10-22 ENCOUNTER — Ambulatory Visit (INDEPENDENT_AMBULATORY_CARE_PROVIDER_SITE_OTHER): Payer: 59 | Admitting: Family Medicine

## 2021-10-22 VITALS — BP 122/76 | HR 70 | Temp 98.0°F | Ht 62.25 in | Wt 233.0 lb

## 2021-10-22 DIAGNOSIS — L989 Disorder of the skin and subcutaneous tissue, unspecified: Secondary | ICD-10-CM

## 2021-10-22 DIAGNOSIS — E1169 Type 2 diabetes mellitus with other specified complication: Secondary | ICD-10-CM

## 2021-10-22 DIAGNOSIS — E785 Hyperlipidemia, unspecified: Secondary | ICD-10-CM | POA: Diagnosis not present

## 2021-10-22 DIAGNOSIS — I10 Essential (primary) hypertension: Secondary | ICD-10-CM | POA: Diagnosis not present

## 2021-10-22 DIAGNOSIS — Z79899 Other long term (current) drug therapy: Secondary | ICD-10-CM

## 2021-10-22 DIAGNOSIS — Z6841 Body Mass Index (BMI) 40.0 and over, adult: Secondary | ICD-10-CM | POA: Diagnosis not present

## 2021-10-22 DIAGNOSIS — M8580 Other specified disorders of bone density and structure, unspecified site: Secondary | ICD-10-CM

## 2021-10-22 DIAGNOSIS — Z5181 Encounter for therapeutic drug level monitoring: Secondary | ICD-10-CM

## 2021-10-22 LAB — MICROALBUMIN / CREATININE URINE RATIO
Creatinine,U: 132.7 mg/dL
Microalb Creat Ratio: 0.8 mg/g (ref 0.0–30.0)
Microalb, Ur: 1.1 mg/dL (ref 0.0–1.9)

## 2021-10-22 LAB — POCT GLYCOSYLATED HEMOGLOBIN (HGB A1C)
HbA1c POC (<> result, manual entry): 6.6 % (ref 4.0–5.6)
HbA1c, POC (controlled diabetic range): 6.6 % (ref 0.0–7.0)
HbA1c, POC (prediabetic range): 6.6 % — AB (ref 5.7–6.4)
Hemoglobin A1C: 6.6 % — AB (ref 4.0–5.6)

## 2021-10-22 MED ORDER — LISINOPRIL 20 MG PO TABS: 20.0000 mg | ORAL_TABLET | Freq: Every day | ORAL | 2 refills | Status: DC

## 2021-10-22 MED ORDER — HYDROCHLOROTHIAZIDE 12.5 MG PO TABS: 12.5000 mg | ORAL_TABLET | Freq: Every day | ORAL | 2 refills | Status: DC

## 2021-10-22 MED ORDER — METFORMIN HCL 500 MG PO TABS: 500.0000 mg | ORAL_TABLET | Freq: Every day | ORAL | 1 refills | Status: DC

## 2021-10-22 MED ORDER — ATORVASTATIN CALCIUM 10 MG PO TABS: 10.0000 mg | ORAL_TABLET | Freq: Every day | ORAL | 3 refills | Status: AC

## 2021-10-22 MED ORDER — GLIPIZIDE 5 MG PO TABS: 2.5000 mg | ORAL_TABLET | Freq: Every day | ORAL | 2 refills | Status: DC

## 2021-10-22 NOTE — Patient Instructions (Addendum)
?  Next appt 9/21 or a few days after for physical and chronic conditions combo appt. We will update all your labs that day as well.  ?

## 2021-10-22 NOTE — Progress Notes (Signed)
This visit occurred during the SARS-CoV-2 public health emergency.  Safety protocols were in place, including screening questions prior to the visit, additional usage of staff PPE, and extensive cleaning of exam room while observing appropriate contact time as indicated for disinfecting solutions.    Patient ID: Donna Russell, female  DOB: 11-14-1955, 66 y.o.   MRN: [id number] Patient Care Team    Relationship Specialty Notifications Start End  Natalia Leatherwood, DO PCP - General Family Medicine  06/12/19   Griselda Miner, MD Consulting Physician General Surgery  06/12/19   Tilda Burrow, MD (Inactive) Consulting Physician Obstetrics and Gynecology  06/12/19   Michaelle Copas, MD Referring Physician Optometry  06/12/19     Chief Complaint  Patient presents with   Hypertension    [facility]; pt is fasting    Subjective: Donna Russell is a 66 y.o.  Female  present for [facility]. All past medical history, surgical history, allergies, family history, immunizations, medications and social history were updated in the electronic medical record today. All recent labs, ED visits and hospitalizations within the last year were reviewed.  Diabetes/obesity/HLD: diagnosed early 2020 w/ a1c 7. Pt reports compliance  with metformin 500 mg daily and glipizide 2.5 mg QD. Patient denies dizziness, hyperglycemic or hypoglycemic events. Patient denies numbness, tingling in the extremities or nonhealing wounds of feet.    Hypertension:  Pt reports  compliance with lisinopril 20 mg daily and HCTZ 12.5 mg Qd. Patient denies chest pain, shortness of breath, dizziness or lower extremity edema.  Pt is prescribed statin.  RF: Hypertension, obesity, diabetes, family history of heart disease  Depression screen [facility] 2/9 10/22/2021 05/12/2021 07/25/2020 10/15/2019 06/12/2019  Decreased Interest 0 0 0 0 0  Down, Depressed, Hopeless 0 0 0 0 0  PHQ - 2 Score 0 0 0 0 0   No flowsheet data found.   Immunization History   Administered Date(s) Administered   Influenza,inj,Quad PF,6+ Mos 06/12/2019, 07/25/2020, 05/12/2021   Moderna Sars-Covid-2 Vaccination 09/03/2020   Pneumococcal Polysaccharide-23 06/12/2019   Tdap 10/13/2012, 02/09/2020   Zoster Recombinat (Shingrix) 10/15/2019, 02/12/2020    Past Medical History:  Diagnosis Date   Chicken pox    Diabetes mellitus without complication (HCC)    Hypertension    Osteopenia 2021   No Known Allergies Past Surgical History:  Procedure Laterality Date   DILATION AND CURETTAGE OF UTERUS     LAPAROSCOPIC APPENDECTOMY N/A 06/19/2015   Procedure: APPENDECTOMY LAPAROSCOPIC;  Surgeon: Chevis Pretty III, MD;  Location: [facility];  Service: General;  Laterality: N/A;   Family History  Problem Relation Age of Onset   Diabetes Mother    Hypertension Mother    Hyperlipidemia Mother    Stomach cancer Father    Hypertension Father    Hypertension Sister    Hypertension Brother    Diabetes Maternal Grandmother    Diabetes Maternal Grandfather    Heart disease Paternal Grandmother    Hypertension Paternal Grandmother    Heart disease Paternal Grandfather    Hyperlipidemia Paternal Grandfather    Hypertension Paternal Grandfather    Hypertension Brother    Social History   Social History Narrative   Marital status/children/pets: Recently widowed 01/2019.  3 children.   Education/employment: High school education.   Safety:      -smoke alarm in the home:Yes     - wears seatbelt: Yes       Allergies as of 10/22/2021   No Known Allergies  Medication List        Accurate as of October 22, 2021  9:09 AM. If you have any questions, ask your nurse or doctor.          atorvastatin 10 MG tablet Commonly known as: LIPITOR Take 1 tablet (10 mg total) by mouth daily.   CO Q 10 PO Take 1 tablet by mouth daily.   Debrox 6.5 % OTIC solution Generic drug: carbamide peroxide Place 5 drops into both ears 2 (two) times daily as needed.   glipiZIDE 5 MG  tablet Commonly known as: GLUCOTROL Take 0.5 tablets (2.5 mg total) by mouth daily before breakfast.   hydrochlorothiazide 12.5 MG tablet Commonly known as: HYDRODIURIL Take 1 tablet (12.5 mg total) by mouth daily.   lisinopril 20 MG tablet Commonly known as: ZESTRIL Take 1 tablet (20 mg total) by mouth daily.   metFORMIN 500 MG tablet Commonly known as: GLUCOPHAGE Take 1 tablet (500 mg total) by mouth daily with breakfast.   multivitamin with minerals Tabs tablet Take 1 tablet by mouth daily.   VITAMIN B-12 PO Take by mouth.   VITAMIN D PO Take by mouth.        All past medical history, surgical history, allergies, family history, immunizations andmedications were updated in the EMR today and reviewed under the history and medication portions of their EMR.     No results found for this or any previous visit (from the past 2160 hour(s)).  ROS: 14 pt review of systems performed and negative (unless mentioned in an HPI)  Objective: BP 122/76    Pulse 70    Temp 98 F (36.7 C) (Oral)    Ht 5' 2.25" (1.581 m)    Wt 233 lb (105.7 kg)    SpO2 97%    BMI 42.27 kg/m  Physical Exam Vitals and nursing note reviewed.  Constitutional:      General: She is not in acute distress.    Appearance: Normal appearance. She is not ill-appearing, toxic-appearing or diaphoretic.  HENT:     Head: Normocephalic and atraumatic.  Eyes:     General: No scleral icterus.       Right eye: No discharge.        Left eye: No discharge.     Extraocular Movements: Extraocular movements intact.     Conjunctiva/sclera: Conjunctivae normal.     Pupils: Pupils are equal, round, and reactive to light.  Cardiovascular:     Rate and Rhythm: Normal rate and regular rhythm.     Heart sounds: No murmur heard.   No gallop.  Pulmonary:     Effort: Pulmonary effort is normal. No respiratory distress.     Breath sounds: Normal breath sounds. No wheezing, rhonchi or rales.  Musculoskeletal:     Cervical  back: Neck supple.     Right lower leg: No edema.     Left lower leg: No edema.  Lymphadenopathy:     Cervical: No cervical adenopathy.  Skin:    General: Skin is warm and dry.     Coloration: Skin is not jaundiced or pale.     Findings: Lesion (red raised, central wht pore ~48mm elft chest wall.) present. No erythema or rash.  Neurological:     Mental Status: She is alert and oriented to person, place, and time. Mental status is at baseline.     Motor: No weakness.     Gait: Gait normal.  Psychiatric:        Mood  and Affect: Mood normal.        Behavior: Behavior normal.        Thought Content: Thought content normal.        Judgment: Judgment normal.     No results found.  Assessment/plan: Sella Buys Lenzo is a 66 y.o. female present for [facility] Essential hypertension/morbid obesity Stable.  Continue  lisinopril 20 mg daily Continue   HCTZ 12.5 mg daily - continue statin -Follow-up every 5.5 months with chronic medical conditions   Type 2 diabetes mellitus with hyperlipidemia (HCC) Stable.   Continue  Metformin 500 mg QD (can not tolerate higher dose) - continue glip 2.5 qd PNA series: Pneumovax 05/2019.   Flu shot: UTD 2022 (recommneded yearly) Microalbumin creatinine ratio 10/15/2019> collect today Foot exam: 10/22/2021 Eye exam: Dr. Conley Rolls, [facility] 10/2020. > due now.  A1c: 7.0>> 6.5>>7.1> 6.1> 6.1>6.3>6.7> 6.6 collected today    Osteopenia, unspecified location Labs Utd- due at cpe Dexa -1.8 UTD - due 2025  Skin lesion: Appears more consistent of comedone. Consider molluscum, but unlikely.  Monitor closely- f/u if changing Return in about 29 weeks (around 05/13/2022) for CPE (30 min), [facility] (30 min).   Orders Placed This Encounter  Procedures   Microalbumin / creatinine urine ratio   POCT HgB A1C   Meds ordered this encounter  Medications   atorvastatin (LIPITOR) 10 MG tablet    Sig: Take 1 tablet (10 mg total) by mouth daily.    Dispense:  90 tablet     Refill:  3   glipiZIDE (GLUCOTROL) 5 MG tablet    Sig: Take 0.5 tablets (2.5 mg total) by mouth daily before breakfast.    Dispense:  45 tablet    Refill:  2   hydrochlorothiazide (HYDRODIURIL) 12.5 MG tablet    Sig: Take 1 tablet (12.5 mg total) by mouth daily.    Dispense:  90 tablet    Refill:  2   lisinopril (ZESTRIL) 20 MG tablet    Sig: Take 1 tablet (20 mg total) by mouth daily.    Dispense:  90 tablet    Refill:  2   metFORMIN (GLUCOPHAGE) 500 MG tablet    Sig: Take 1 tablet (500 mg total) by mouth daily with breakfast.    Dispense:  90 tablet    Refill:  1   Referral Orders  No referral(s) requested today     Electronically signed by: Felix Pacini, DO  [facility] [place]

## 2021-11-16 ENCOUNTER — Other Ambulatory Visit (HOSPITAL_COMMUNITY): Payer: Self-pay | Admitting: Family Medicine

## 2021-11-16 DIAGNOSIS — Z1231 Encounter for screening mammogram for malignant neoplasm of breast: Secondary | ICD-10-CM

## 2021-12-24 ENCOUNTER — Ambulatory Visit (HOSPITAL_COMMUNITY)
Admission: RE | Admit: 2021-12-24 | Discharge: 2021-12-24 | Disposition: A | Discharge status: Home / Self Care | Payer: 59 | Source: Ambulatory Visit | Attending: Family Medicine | Admitting: Family Medicine

## 2021-12-24 DIAGNOSIS — Z1231 Encounter for screening mammogram for malignant neoplasm of breast: Secondary | ICD-10-CM | POA: Diagnosis not present

## 2022-05-14 ENCOUNTER — Encounter: Payer: 59 | Admitting: Family Medicine

## 2022-05-18 ENCOUNTER — Other Ambulatory Visit: Payer: Self-pay

## 2022-05-18 MED ORDER — METFORMIN HCL 500 MG PO TABS: 500.0000 mg | ORAL_TABLET | Freq: Every day | ORAL | 0 refills | Status: DC

## 2022-05-19 ENCOUNTER — Telehealth: Payer: Self-pay

## 2022-05-19 NOTE — Telephone Encounter (Signed)
LM for pt to return call to discuss.   Pt should be able to call pharmacy to have medication transferred to preferred pharmacy.

## 2022-05-19 NOTE — Telephone Encounter (Signed)
Pt called and I stated she called [facility] and had the medication transferred. She ask that all her medications be transferred to the [facility] in [place] .

## 2022-05-19 NOTE — Telephone Encounter (Signed)
error 

## 2022-05-19 NOTE — Telephone Encounter (Signed)
Patient changing pharmacy due to insurance. Patient will need new prescriptions sent to [place]  Patient has CPE appt scheduled 11/2 with Dr. Raoul Pitch  atorvastatin (LIPITOR) 10 MG tablet [[id number]]   glipiZIDE (GLUCOTROL) 5 MG tablet [[id number]]   hydrochlorothiazide (HYDRODIURIL) 12.5 MG tablet [[id number]]   lisinopril (ZESTRIL) 20 MG tablet [[id number]]   metFORMIN (GLUCOPHAGE) 500 MG tablet [[id number]]

## 2022-05-20 NOTE — Telephone Encounter (Signed)
LVM for pt to CB regarding medications transfer.   Note:  Pt do not need to contact our office to transfer scripts just the pharmacy she wants meds to go to.

## 2022-05-21 NOTE — Telephone Encounter (Signed)
LVM for pt to CB regarding medications transfer.

## 2022-06-04 ENCOUNTER — Ambulatory Visit: Payer: 59 | Admitting: Family Medicine

## 2022-06-10 LAB — HM DIABETES EYE EXAM

## 2022-06-17 ENCOUNTER — Other Ambulatory Visit: Payer: Self-pay

## 2022-06-22 ENCOUNTER — Other Ambulatory Visit: Payer: Self-pay

## 2022-06-24 ENCOUNTER — Ambulatory Visit (INDEPENDENT_AMBULATORY_CARE_PROVIDER_SITE_OTHER): Payer: Medicare Other | Admitting: Family Medicine

## 2022-06-24 ENCOUNTER — Encounter: Payer: Self-pay | Admitting: Family Medicine

## 2022-06-24 VITALS — BP 102/69 | HR 88 | Temp 97.8°F | Ht 62.25 in | Wt 234.4 lb

## 2022-06-24 DIAGNOSIS — Z6841 Body Mass Index (BMI) 40.0 and over, adult: Secondary | ICD-10-CM

## 2022-06-24 DIAGNOSIS — M8580 Other specified disorders of bone density and structure, unspecified site: Secondary | ICD-10-CM | POA: Diagnosis not present

## 2022-06-24 DIAGNOSIS — Z23 Encounter for immunization: Secondary | ICD-10-CM | POA: Diagnosis not present

## 2022-06-24 DIAGNOSIS — Z5181 Encounter for therapeutic drug level monitoring: Secondary | ICD-10-CM

## 2022-06-24 DIAGNOSIS — E785 Hyperlipidemia, unspecified: Secondary | ICD-10-CM | POA: Diagnosis not present

## 2022-06-24 DIAGNOSIS — Z0000 Encounter for general adult medical examination without abnormal findings: Secondary | ICD-10-CM

## 2022-06-24 DIAGNOSIS — E1169 Type 2 diabetes mellitus with other specified complication: Secondary | ICD-10-CM | POA: Diagnosis not present

## 2022-06-24 DIAGNOSIS — Z79899 Other long term (current) drug therapy: Secondary | ICD-10-CM

## 2022-06-24 DIAGNOSIS — I10 Essential (primary) hypertension: Secondary | ICD-10-CM | POA: Diagnosis not present

## 2022-06-24 DIAGNOSIS — Z1231 Encounter for screening mammogram for malignant neoplasm of breast: Secondary | ICD-10-CM | POA: Diagnosis not present

## 2022-06-24 LAB — LIPID PANEL
Cholesterol: 100 mg/dL (ref 0–200)
HDL: 40.6 mg/dL (ref >39.00)
LDL Cholesterol: 39 mg/dL (ref 0–99)
NonHDL: 59.2
Total CHOL/HDL Ratio: 2
Triglycerides: 101 mg/dL (ref 0.0–149.0)
VLDL: 20.2 mg/dL (ref 0.0–40.0)

## 2022-06-24 LAB — CBC WITH DIFFERENTIAL/PLATELET
Basophils Absolute: 0 10*3/uL (ref 0.0–0.1)
Basophils Relative: 0.5 % (ref 0.0–3.0)
Eosinophils Absolute: 0 10*3/uL (ref 0.0–0.7)
Eosinophils Relative: 0.4 % (ref 0.0–5.0)
HCT: 44.5 % (ref 36.0–46.0)
Hemoglobin: 15 g/dL (ref 12.0–15.0)
Lymphocytes Relative: 14.5 % (ref 12.0–46.0)
Lymphs Abs: 0.9 10*3/uL (ref 0.7–4.0)
MCHC: 33.7 g/dL (ref 30.0–36.0)
MCV: 88.2 fl (ref 78.0–100.0)
Monocytes Absolute: 0.3 10*3/uL (ref 0.1–1.0)
Monocytes Relative: 5.5 % (ref 3.0–12.0)
Neutro Abs: 4.9 10*3/uL (ref 1.4–7.7)
Neutrophils Relative %: 79.1 % — ABNORMAL HIGH (ref 43.0–77.0)
Platelets: 242 10*3/uL (ref 150.0–400.0)
RBC: 5.05 Mil/uL (ref 3.87–5.11)
RDW: 13.1 % (ref 11.5–15.5)
WBC: 6.2 10*3/uL (ref 4.0–10.5)

## 2022-06-24 LAB — COMPREHENSIVE METABOLIC PANEL
ALT: 22 U/L (ref 0–35)
AST: 20 U/L (ref 0–37)
Albumin: 4.1 g/dL (ref 3.5–5.2)
Alkaline Phosphatase: 76 U/L (ref 39–117)
BUN: 17 mg/dL (ref 6–23)
CO2: 28 mEq/L (ref 19–32)
Calcium: 8.9 mg/dL (ref 8.4–10.5)
Chloride: 100 mEq/L (ref 96–112)
Creatinine, Ser: 0.76 mg/dL (ref 0.40–1.20)
GFR: 82.47 mL/min (ref >60.00)
Glucose, Bld: 161 mg/dL — ABNORMAL HIGH (ref 70–99)
Potassium: 3.6 mEq/L (ref 3.5–5.1)
Sodium: 136 mEq/L (ref 135–145)
Total Bilirubin: 0.8 mg/dL (ref 0.2–1.2)
Total Protein: 6.8 g/dL (ref 6.0–8.3)

## 2022-06-24 LAB — VITAMIN D 25 HYDROXY (VIT D DEFICIENCY, FRACTURES): VITD: 28.16 ng/mL — ABNORMAL LOW (ref 30.00–100.00)

## 2022-06-24 LAB — HEMOGLOBIN A1C: Hgb A1c MFr Bld: 7.3 % — ABNORMAL HIGH (ref 4.6–6.5)

## 2022-06-24 LAB — TSH: TSH: 2.01 u[IU]/mL (ref 0.35–5.50)

## 2022-06-24 MED ORDER — LISINOPRIL 20 MG PO TABS: 20.0000 mg | ORAL_TABLET | Freq: Every day | ORAL | 1 refills | Status: AC

## 2022-06-24 MED ORDER — HYDROCHLOROTHIAZIDE 12.5 MG PO TABS: 12.5000 mg | ORAL_TABLET | Freq: Every day | ORAL | 1 refills | Status: AC

## 2022-06-24 MED ORDER — METFORMIN HCL 500 MG PO TABS: 500.0000 mg | ORAL_TABLET | Freq: Every day | ORAL | 1 refills | Status: AC

## 2022-06-24 MED ORDER — GLIPIZIDE 5 MG PO TABS: 2.5000 mg | ORAL_TABLET | Freq: Every day | ORAL | 1 refills | Status: AC

## 2022-06-24 NOTE — Progress Notes (Signed)
WELCOME to medicare preventive exam visit  Patient Care Team    Relationship Specialty Notifications Start End  Natalia Leatherwood, DO PCP - General Family Medicine  06/12/19   Griselda Miner, MD Consulting Physician General Surgery  06/12/19   Tilda Burrow, MD (Inactive) Consulting Physician Obstetrics and Gynecology  06/12/19   Michaelle Copas, MD Referring Physician Optometry  06/12/19     Chief Complaint  Patient presents with   Medicare Wellness    Welcome to [facility] preventive cpe    History of Present Ilness: Donna Russell, 66 y.o. , female presents today for WELCOME to  Medicare wellness preventive exam visit.    Health maintenance:  Colonoscopy: cologuard 10/25/2019> next screen 10/2022 Mammogram: completed 12/24/2021 at [facility]. Ordered today for 2024 Cervical cancer screening: completed today with co-test.  Immunizations: tdap UTD 2021, Influenza 04/23/2022 (encouraged yearly), PNA20 completed today.. Shingrix  completed. Covid counseled.   Infectious disease screening: HIV declined, Hep C completed  DEXA: 10/22/2019 -1.8, osteopenia ; rpt 2024 Assistive device: none Oxygen IOE:VOJJ Patient has a Dental home. Hospitalizations/ED visits: reviewed Glaucoma screen: 06/10/22 by Dr. Despina Arias, results normal  Diabetes/obesity/HLD: diagnosed early 2020 w/ a1c 7. Pt reports compliance with metformin 500 mg daily and glipizide 2.5 mg QD. Patient denies dizziness, hyperglycemic or hypoglycemic events. Patient denies numbness, tingling in the extremities or nonhealing wounds of feet.    Hypertension:  Pt reports compliance with lisinopril 20 mg daily and HCTZ 12.5 mg Qd.Patient denies chest pain, shortness of breath, dizziness or lower extremity edema.  Pt is prescribed statin.  RF: Hypertension, obesity, diabetes, family history of heart disease Past medical, surgical, family and social histories reviewed (including experiences with illnesses, hospital stays, operations, injuries, and  treatments):  Past Medical History:  Diagnosis Date   Chicken pox    Diabetes mellitus without complication (HCC)    Hypertension    Osteopenia 2021   All allergies reviewed No Known Allergies Past Surgical History:  Procedure Laterality Date   DILATION AND CURETTAGE OF UTERUS     LAPAROSCOPIC APPENDECTOMY N/A 06/19/2015   Procedure: APPENDECTOMY LAPAROSCOPIC;  Surgeon: Chevis Pretty III, MD;  Location: [facility];  Service: General;  Laterality: N/A;   Family History  Problem Relation Age of Onset   Diabetes Mother    Hypertension Mother    Hyperlipidemia Mother    Stomach cancer Father    Hypertension Father    Hypertension Sister    Hypertension Brother    Diabetes Maternal Grandmother    Diabetes Maternal Grandfather    Heart disease Paternal Grandmother    Hypertension Paternal Grandmother    Heart disease Paternal Grandfather    Hyperlipidemia Paternal Grandfather    Hypertension Paternal Grandfather    Hypertension Brother    Social History   Social History Narrative   Marital status/children/pets: Recently widowed 01/2019.  3 children.   Education/employment: High school education.   Safety:      -smoke alarm in the home:Yes     - wears seatbelt: Yes       All medications verified Allergies as of 06/24/2022   No Known Allergies      Medication List        Accurate as of June 24, 2022 10:17 AM. If you have any questions, ask your nurse or doctor.          STOP taking these medications    Debrox 6.5 % OTIC solution Generic drug: carbamide peroxide  Stopped by: Felix Pacini, DO       TAKE these medications    atorvastatin 10 MG tablet Commonly known as: LIPITOR Take 1 tablet (10 mg total) by mouth daily.   CO Q 10 PO Take 1 tablet by mouth daily.   glipiZIDE 5 MG tablet Commonly known as: GLUCOTROL Take 0.5 tablets (2.5 mg total) by mouth daily before breakfast.   hydrochlorothiazide 12.5 MG tablet Commonly known as: HYDRODIURIL Take 1  tablet (12.5 mg total) by mouth daily.   lisinopril 20 MG tablet Commonly known as: ZESTRIL Take 1 tablet (20 mg total) by mouth daily.   metFORMIN 500 MG tablet Commonly known as: GLUCOPHAGE Take 1 tablet (500 mg total) by mouth daily with breakfast.   multivitamin with minerals Tabs tablet Take 1 tablet by mouth daily.   VITAMIN B-12 PO Take by mouth.   VITAMIN D PO Take by mouth.        Depression Screen    06/24/2022    8:23 AM 10/22/2021    8:58 AM 05/12/2021    9:29 AM 07/25/2020   10:01 AM 10/15/2019    8:31 AM  Depression screen PHQ 2/9  Decreased Interest 0 0 0 0 0  Down, Depressed, Hopeless 0 0 0 0 0  PHQ - 2 Score 0 0 0 0 0       No data to display           Immunizations: Immunization History  Administered Date(s) Administered   Hep B, Unspecified 03/23/2015   Influenza Inj Mdck Quad Pf 04/23/2022   Influenza, Seasonal, Injecte, Preservative Fre 06/19/2013   Influenza,inj,Quad PF,6+ Mos 06/12/2019, 07/25/2020, 05/12/2021   Influenza-Unspecified 06/12/2019, 07/25/2020, 05/12/2021   Moderna Sars-Covid-2 Vaccination 09/03/2020   Pneumococcal Polysaccharide-23 06/12/2019   Tdap 10/13/2012, 03/23/2015, 02/09/2020   Zoster Recombinat (Shingrix) 10/15/2019, 02/12/2020    Cognitive Function        06/24/2022    8:27 AM  6CIT Screen  What Year? 0 points  What month? 0 points  What time? 0 points  Count back from 20 0 points  Months in reverse 0 points  Repeat phrase 0 points  Total Score 0 points    Exercise: Current Exercise Habits: Home exercise routine, Type of exercise: walking, Time (Minutes): 20, Frequency (Times/Week): 1, Weekly Exercise (Minutes/Week): 20 Exercise limited by: None identified  Diet: Regular  Functional Status Survey: Get up and go test: steady and less than 20 seconds.  Is the patient deaf or have difficulty hearing?: No Does the patient have difficulty seeing, even when wearing glasses/contacts?: No Does the  patient have difficulty concentrating, remembering, or making decisions?: No Does the patient have difficulty walking or climbing stairs?: No Does the patient have difficulty dressing or bathing?: No Does the patient have difficulty doing errands alone such as visiting a doctor's office or shopping?: No Current Exercise Habits: Home exercise routine, Type of exercise: walking, Time (Minutes): 20, Frequency (Times/Week): 1, Weekly Exercise (Minutes/Week): 20 Exercise limited by: None identified    06/24/2022    8:26 AM 05/12/2021    9:29 AM 06/12/2019    6:11 PM 08/03/2018    4:26 PM  Fall Risk   Falls in the past year? 0 0 0 0  Number falls in past yr: 0 0 0   Injury with Fall? 0 0 0   Risk for fall due to : No Fall Risks     Follow up Falls evaluation completed Falls evaluation completed Falls evaluation completed  Physical Activity: Insufficiently Active (06/24/2022)   Exercise Vital Sign    Days of Exercise per Week: 1 day    Minutes of Exercise per Session: 20 min    Social Connections: Moderately Isolated (06/24/2022)   Social Connection and Isolation Panel [NHANES]    Frequency of Communication with Friends and Family: More than three times a week    Frequency of Social Gatherings with Friends and Family: More than three times a week    Attends Religious Services: More than 4 times per year    Active Member of [employer] or Organizations: No    Attends Banker Meetings: Never    Marital Status: Widowed   Social Determinants of Health with Concerns   Tobacco Use: Medium Risk (06/24/2022)   Patient History    Smoking Tobacco Use: Former    Smokeless Tobacco Use: Never    Passive Exposure: Not on file  Physical Activity: Insufficiently Active (06/24/2022)   Exercise Vital Sign    Days of Exercise per Week: 1 day    Minutes of Exercise per Session: 20 min  Social Connections: Moderately Isolated (06/24/2022)   Social Connection and Isolation Panel [NHANES]     Frequency of Communication with Friends and Family: More than three times a week    Frequency of Social Gatherings with Friends and Family: More than three times a week    Attends Religious Services: More than 4 times per year    Active Member of [employer] or Organizations: No    Attends Banker Meetings: Never    Marital Status: Widowed   Tobacco Use: Medium Risk (06/24/2022)   Patient History    Smoking Tobacco Use: Former    Smokeless Tobacco Use: Never    Passive Exposure: Not on file   @ Flowsheet Row Office Visit from [facility]/24/2022 [facility]  AUDIT-C Score 0       Advanced Directives: has an advanced directive - a copy HAS NOT been provided.  Review of Systems  Constitutional: Negative.   HENT: Negative.    Eyes: Negative.   Respiratory: Negative.    Cardiovascular: Negative.   Gastrointestinal: Negative.   Genitourinary: Negative.   Musculoskeletal: Negative.   Skin: Negative.   Neurological: Negative.   Endo/Heme/Allergies: Negative.   Psychiatric/Behavioral: Negative.    All other systems reviewed and are negative.   Objective  BP 102/69   Pulse 88   Temp 97.8 F (36.6 C)   Ht 5' 2.25" (1.581 m)   Wt 234 lb 6.4 oz (106.3 kg)   SpO2 94%   BMI 42.53 kg/m  Physical Exam Vitals and nursing note reviewed.  Constitutional:      General: She is not in acute distress.    Appearance: Normal appearance. She is not ill-appearing or toxic-appearing.  HENT:     Head: Normocephalic and atraumatic.     Right Ear: Tympanic membrane, ear canal and external ear normal. There is no impacted cerumen.     Left Ear: Tympanic membrane, ear canal and external ear normal. There is no impacted cerumen.     Nose: No congestion or rhinorrhea.     Mouth/Throat:     Mouth: Mucous membranes are moist.     Pharynx: Oropharynx is clear. No oropharyngeal exudate or posterior oropharyngeal erythema.  Eyes:     General: No scleral icterus.        Right eye: No discharge.        Left eye:  No discharge.     Extraocular Movements: Extraocular movements intact.     Conjunctiva/sclera: Conjunctivae normal.     Pupils: Pupils are equal, round, and reactive to light.  Cardiovascular:     Rate and Rhythm: Normal rate and regular rhythm.     Pulses: Normal pulses.     Heart sounds: Normal heart sounds. No murmur heard.    No friction rub. No gallop.  Pulmonary:     Effort: Pulmonary effort is normal. No respiratory distress.     Breath sounds: Normal breath sounds. No stridor. No wheezing, rhonchi or rales.  Chest:     Chest wall: No tenderness.  Abdominal:     General: Abdomen is flat. Bowel sounds are normal. There is no distension.     Palpations: Abdomen is soft. There is no mass.     Tenderness: There is no abdominal tenderness. There is no right CVA tenderness, left CVA tenderness, guarding or rebound.     Hernia: No hernia is present.  Musculoskeletal:        General: No swelling, tenderness or deformity. Normal range of motion.     Cervical back: Normal range of motion and neck supple. No rigidity or tenderness.     Right lower leg: No edema.     Left lower leg: No edema.  Lymphadenopathy:     Cervical: No cervical adenopathy.  Skin:    General: Skin is warm and dry.     Coloration: Skin is not jaundiced or pale.     Findings: No bruising, erythema, lesion or rash.  Neurological:     General: No focal deficit present.     Mental Status: She is alert and oriented to person, place, and time. Mental status is at baseline.     Cranial Nerves: No cranial nerve deficit.     Sensory: No sensory deficit.     Motor: No weakness.     Coordination: Coordination normal.     Gait: Gait normal.     Deep Tendon Reflexes: Reflexes normal.  Psychiatric:        Mood and Affect: Mood normal.        Behavior: Behavior normal.        Thought Content: Thought content normal.        Judgment: Judgment normal.      Hearing  Screening   500Hz  1000Hz  2000Hz  4000Hz   Right ear 20 20 20 20   Left ear 20 20 20 20    Vision Screening   Right eye Left eye Both eyes  Without correction     With correction 20/20 20/20 20/20      Assessment and Plan: Donna Russell is a 66 y.o. patient present today for their welcome to Medicare Wellness preventative exam  and combination chronic medical condition follow-up visit Essential hypertension/morbid obesity Stable Continue lisinopril 20 mg daily Continue HCTZ 12.5 mg daily - continue statin -Follow-up every 5.5 months with chronic medical conditions   Type 2 diabetes mellitus with hyperlipidemia (HCC) Stable Continue metformin 500 mg QD (can not tolerate higher dose) Continue glip 2.5 qd PNA series: Pneumovax completed today Flu shot: UTD UTD 2023 (recommneded yearly) Microalbumin creatinine ratio UTD Foot exam: 10/22/2021 Eye exam: Dr. , [facility] UTD 2023 A1c: 7.0>> 6.5>>7.1> 6.1> 6.1>6.3>6.7> 6.6 >collected today  - CBC with Differential/Platelet - Comprehensive metabolic panel - Hemoglobin A1c - Lipid panel - TSH   Osteopenia, unspecified location Vitamin D collected today Dexa -1.8 UTD - due 2024  medical examination at a health care facility Patient was encouraged to exercise greater than 150 minutes a week. Patient was encouraged to choose a diet filled with fresh fruits and vegetables, and lean meats. AVS provided to patient today for education/recommendation on gender specific health and safety maintenance. Colonoscopy: cologuard 10/25/2019> next screen 10/2022 Mammogram: completed 12/24/2021 at [facility]. Ordered today for 2024 Cervical cancer screening: completed today with co-test.  Immunizations: tdap UTD 2021, Influenza 04/23/2022 (encouraged yearly), PNA20 completed today.. Shingrix  completed. Covid counseled.   Infectious disease screening: HIV declined, Hep C completed  DEXA: 10/22/2019 -1.8, osteopenia ; rpt 2024 STIs screening: Completed  if indicated Tobacco Use: Medium Risk (06/24/2022)   Patient History    Smoking Tobacco Use: Former    Smokeless Tobacco Use: Never    Passive Exposure: Not on file   The patient is not currently a tobacco user. Counseling given: Not Answered Tobacco comments: smoke for 12 years- quit at age 70 yrs old    The patient is asked to make an attempt to improve diet and exercise patterns to aid in medical management of chronic problems and improve overall health.    Goals      Quality of Life Maintained     Evidence-based guidance:  Assess patient's thoughts about quality of life, goals and expectations, and dissatisfaction or desire to improve.  Identify issues of primary importance such as mental health, illness, exercise tolerance, pain, sexual function and intimacy, cognitive change, social isolation, finances and relationships.  Assess and monitor for signs/symptoms of psychosocial concerns, especially depression or ideations regarding harm to others or self; provide or refer for mental health services as needed.  Identify sensory issues that impact quality of life such as hearing loss, vision deficit; strategize ways to maintain or improve hearing, vision.  Promote access to services in the community to support independence such as support groups, home visiting programs, financial assistance, handicapped parking tags, durable medical equipment and emergency responder.  Promote activities to decrease social isolation such as group support or social, leisure and recreational activities, employment, use of social media; consider safety concerns about being out of home for activities.  Provide patient an opportunity to share by storytelling or a "life review" to give positive meaning to life and to assist with coping and negative experiences.  Encourage patient to tap into hope to improve sense of self.  Counsel based on prognosis and as early as possible about end-of-life and palliative care;  consider referral to palliative care provider.  Advocate for the development of palliative care plan that may include avoidance of unnecessary testing and intervention, symptom control, discontinuation of medications, hospice and organ donation.  Counsel as early as possible those with life-limiting chronic disease about palliative care; consider referral to palliative care provider.  Advocate for the development of palliative care plan.   Notes:           I have personally reviewed and noted the following in the patient's chart:   Medical and social history Use of alcohol, tobacco or illicit drugs  Current medications and supplements Functional ability and status Nutritional status Physical activity Advanced directives List of other physicians Hospitalizations, surgeries, and ER visits in previous 12 months Vitals Screenings to include cognitive, depression, and falls Referrals and appointments  In addition, I have reviewed and discussed with patient certain preventive protocols, quality metrics, and best practice recommendations. A written personalized care plan for preventive services as well as general preventive health recommendations were provided  to patient.     Electronically Signed by: Felix Pacinienee Willodean Leven, DO [facility] [place]

## 2022-06-24 NOTE — Patient Instructions (Addendum)
Return in about 24 weeks (around 12/09/2022) for Routine chronic condition follow-up.        Great to see you today.  I have refilled the medication(s) we provide.   If labs were collected, we will inform you of lab results once received either by echart message or telephone call.   - echart message- for normal results that have been seen by the patient already.   - telephone call: abnormal results or if patient has not viewed results in their echart.    Donna Russell , Thank you for taking time to come for your Medicare Wellness Visit. I appreciate your ongoing commitment to your health goals. Please review the following plan we discussed and let me know if I can assist you in the future.   These are the goals we discussed:  Goals      Quality of Life Maintained     Evidence-based guidance:  Assess patient's thoughts about quality of life, goals and expectations, and dissatisfaction or desire to improve.  Identify issues of primary importance such as mental health, illness, exercise tolerance, pain, sexual function and intimacy, cognitive change, social isolation, finances and relationships.  Assess and monitor for signs/symptoms of psychosocial concerns, especially depression or ideations regarding harm to others or self; provide or refer for mental health services as needed.  Identify sensory issues that impact quality of life such as hearing loss, vision deficit; strategize ways to maintain or improve hearing, vision.  Promote access to services in the community to support independence such as support groups, home visiting programs, financial assistance, handicapped parking tags, durable medical equipment and emergency responder.  Promote activities to decrease social isolation such as group support or social, leisure and recreational activities, employment, use of social media; consider safety concerns about being out of home for activities.  Provide patient an opportunity to share  by storytelling or a "life review" to give positive meaning to life and to assist with coping and negative experiences.  Encourage patient to tap into hope to improve sense of self.  Counsel based on prognosis and as early as possible about end-of-life and palliative care; consider referral to palliative care provider.  Advocate for the development of palliative care plan that may include avoidance of unnecessary testing and intervention, symptom control, discontinuation of medications, hospice and organ donation.  Counsel as early as possible those with life-limiting chronic disease about palliative care; consider referral to palliative care provider.  Advocate for the development of palliative care plan.   Notes:         This is a list of the screening recommended for you and due dates:  Health Maintenance  Topic Date Due   Hemoglobin A1C  04/24/2022   Yearly kidney function blood test for diabetes  05/12/2022   Pneumonia Vaccine (2 - PCV) 06/20/2022   COVID-19 Vaccine (2 - Moderna risk series) 11/08/2023*   DEXA scan (bone density measurement)  10/22/2022   Yearly kidney health urinalysis for diabetes  10/23/2022   Complete foot exam   10/23/2022   Cologuard (Stool DNA test)  10/25/2022   Mammogram  12/25/2022   Eye exam for diabetics  06/11/2023   Medicare Annual Wellness Visit  06/25/2023   Tetanus Vaccine  02/08/2030   Flu Shot  Completed   Hepatitis C Screening: USPSTF Recommendation to screen - Ages 18-79 yo.  Completed   Zoster (Shingles) Vaccine  Completed   HPV Vaccine  Aged Out   HIV Screening  Discontinued  *Topic  was postponed. The date shown is not the original due date.      Health Maintenance After Age 60 After age 25, you are at a higher risk for certain long-term diseases and infections as well as injuries from falls. Falls are a major cause of broken bones and head injuries in people who are older than age 36. Getting regular preventive care can help to keep  you healthy and well. Preventive care includes getting regular testing and making lifestyle changes as recommended by your health care provider. Talk with your health care provider about: Which screenings and tests you should have. A screening is a test that checks for a disease when you have no symptoms. A diet and exercise plan that is right for you. What should I know about screenings and tests to prevent falls? Screening and testing are the best ways to find a health problem early. Early diagnosis and treatment give you the best chance of managing medical conditions that are common after age 3. Certain conditions and lifestyle choices may make you more likely to have a fall. Your health care provider may recommend: Regular vision checks. Poor vision and conditions such as cataracts can make you more likely to have a fall. If you wear glasses, make sure to get your prescription updated if your vision changes. Medicine review. Work with your health care provider to regularly review all of the medicines you are taking, including over-the-counter medicines. Ask your health care provider about any side effects that may make you more likely to have a fall. Tell your health care provider if any medicines that you take make you feel dizzy or sleepy. Strength and balance checks. Your health care provider may recommend certain tests to check your strength and balance while standing, walking, or changing positions. Foot health exam. Foot pain and numbness, as well as not wearing proper footwear, can make you more likely to have a fall. Screenings, including: Osteoporosis screening. Osteoporosis is a condition that causes the bones to get weaker and break more easily. Blood pressure screening. Blood pressure changes and medicines to control blood pressure can make you feel dizzy. Depression screening. You may be more likely to have a fall if you have a fear of falling, feel depressed, or feel unable to do  activities that you used to do. Alcohol use screening. Using too much alcohol can affect your balance and may make you more likely to have a fall. Follow these instructions at home: Lifestyle Do not drink alcohol if: Your health care provider tells you not to drink. If you drink alcohol: Limit how much you have to: 0-1 drink a day for women. 0-2 drinks a day for men. Know how much alcohol is in your drink. In the [place] one drink equals one 12 oz bottle of beer (355 mL), one 5 oz glass of wine (148 mL), or one 1 oz glass of hard liquor (44 mL). Do not use any products that contain nicotine or tobacco. These products include cigarettes, chewing tobacco, and vaping devices, such as e-cigarettes. If you need help quitting, ask your health care provider. Activity  Follow a regular exercise program to stay fit. This will help you maintain your balance. Ask your health care provider what types of exercise are appropriate for you. If you need a cane or walker, use it as recommended by your health care provider. Wear supportive shoes that have nonskid soles. Safety  Remove any tripping hazards, such as rugs, cords, and clutter. Install  safety equipment such as grab bars in bathrooms and safety rails on stairs. Keep rooms and walkways well-lit. General instructions Talk with your health care provider about your risks for falling. Tell your health care provider if: You fall. Be sure to tell your health care provider about all falls, even ones that seem minor. You feel dizzy, tiredness (fatigue), or off-balance. Take over-the-counter and prescription medicines only as told by your health care provider. These include supplements. Eat a healthy diet and maintain a healthy weight. A healthy diet includes low-fat dairy products, low-fat (lean) meats, and fiber from whole grains, beans, and lots of fruits and vegetables. Stay current with your vaccines. Schedule regular health, dental, and eye  exams. Summary Having a healthy lifestyle and getting preventive care can help to protect your health and wellness after age 44. Screening and testing are the best way to find a health problem early and help you avoid having a fall. Early diagnosis and treatment give you the best chance for managing medical conditions that are more common for people who are older than age 55. Falls are a major cause of broken bones and head injuries in people who are older than age 81. Take precautions to prevent a fall at home. Work with your health care provider to learn what changes you can make to improve your health and wellness and to prevent falls. This information is not intended to replace advice given to you by your health care provider. Make sure you discuss any questions you have with your health care provider. Document Revised: 12/29/2020 Document Reviewed: 12/29/2020 Elsevier Patient Education  [place].

## 2022-06-25 ENCOUNTER — Other Ambulatory Visit: Payer: Self-pay

## 2022-06-25 ENCOUNTER — Telehealth: Payer: Self-pay | Admitting: Family Medicine

## 2022-06-25 MED ORDER — BLOOD GLUCOSE METER KIT: PACK | 0 refills | Status: AC

## 2022-06-25 NOTE — Telephone Encounter (Signed)
Rx sent 

## 2022-06-25 NOTE — Telephone Encounter (Signed)
Pharmacy called and will be faxing over some information. Medicare part B will only cover testing once per day not four.

## 2022-06-25 NOTE — Telephone Encounter (Signed)
noted 

## 2022-06-25 NOTE — Telephone Encounter (Signed)
Pt called to request Dr. Raoul Pitch prescribe her diabetic needles/ strips and request a new meter. She was informed that medicare would cover this if prescribed by Dr. Raoul Pitch. She was told by Clinical staff this would be handled and wanted to follow up.

## 2022-06-28 ENCOUNTER — Telehealth: Payer: Self-pay | Admitting: Family Medicine

## 2022-06-28 NOTE — Telephone Encounter (Signed)
Informed patient of results and she was fine  with that and had no additional questions or concerns

## 2022-06-28 NOTE — Telephone Encounter (Signed)
Please call patient Liver, kidney and thyroid function are normal Blood cell counts and electrolytes are normal Diabetes screening/A1c increased to 7.3, from 6.6.  We will keep medication the same for now.  But if continues to rise on follow-up visit, would need to increase regimen.  Routine exercise and following a diabetic diet is encouraged. Cholesterol panel looks excellent. Vitamin D mildly low at 28.  Increase supplement by 1000 units of vitamin D3 daily.

## 2022-06-28 NOTE — Telephone Encounter (Signed)
LM for pt to return call to discuss.  

## 2022-09-13 ENCOUNTER — Telehealth: Payer: Self-pay | Admitting: Family Medicine

## 2022-09-13 NOTE — Telephone Encounter (Signed)
Please advise 

## 2022-09-13 NOTE — Telephone Encounter (Signed)
Pt declined scheduling an appointment to discuss supplements. She said can't I just ask a nurse. She would like to know is it safe for her to take Apple Cider vinegar pill and MCT wellness?

## 2022-09-13 NOTE — Telephone Encounter (Signed)
She should ask her pharmacist.

## 2022-11-11 ENCOUNTER — Other Ambulatory Visit: Payer: Self-pay | Admitting: Family Medicine

## 2022-12-09 ENCOUNTER — Ambulatory Visit: Payer: No Typology Code available for payment source | Admitting: Family Medicine

## 2023-03-15 LAB — COLOGUARD: COLOGUARD: NEGATIVE

## 2023-04-05 ENCOUNTER — Other Ambulatory Visit (HOSPITAL_COMMUNITY): Payer: Self-pay | Admitting: Physician Assistant

## 2023-04-05 ENCOUNTER — Encounter (HOSPITAL_COMMUNITY): Payer: Self-pay | Admitting: Physician Assistant

## 2023-04-05 DIAGNOSIS — Z1231 Encounter for screening mammogram for malignant neoplasm of breast: Secondary | ICD-10-CM

## 2023-04-05 DIAGNOSIS — M810 Age-related osteoporosis without current pathological fracture: Secondary | ICD-10-CM

## 2023-04-14 ENCOUNTER — Encounter (HOSPITAL_COMMUNITY): Payer: Self-pay

## 2023-04-14 ENCOUNTER — Ambulatory Visit (HOSPITAL_COMMUNITY)
Admission: RE | Admit: 2023-04-14 | Discharge: 2023-04-14 | Disposition: A | Discharge status: Home / Self Care | Payer: Medicare Other | Source: Ambulatory Visit | Attending: Physician Assistant | Admitting: Physician Assistant

## 2023-04-14 DIAGNOSIS — Z1231 Encounter for screening mammogram for malignant neoplasm of breast: Secondary | ICD-10-CM | POA: Diagnosis present

## 2023-04-14 DIAGNOSIS — M810 Age-related osteoporosis without current pathological fracture: Secondary | ICD-10-CM | POA: Diagnosis present

## 2023-12-06 ENCOUNTER — Other Ambulatory Visit: Payer: Self-pay

## 2023-12-06 ENCOUNTER — Emergency Department (HOSPITAL_COMMUNITY)
Admission: EM | Admit: 2023-12-06 | Discharge: 2023-12-07 | Disposition: A | Discharge status: Home / Self Care | Attending: Emergency Medicine | Admitting: Emergency Medicine

## 2023-12-06 DIAGNOSIS — N950 Postmenopausal bleeding: Secondary | ICD-10-CM | POA: Diagnosis not present

## 2023-12-06 DIAGNOSIS — K5792 Diverticulitis of intestine, part unspecified, without perforation or abscess without bleeding: Secondary | ICD-10-CM

## 2023-12-06 DIAGNOSIS — R109 Unspecified abdominal pain: Secondary | ICD-10-CM | POA: Diagnosis present

## 2023-12-06 DIAGNOSIS — K5730 Diverticulosis of large intestine without perforation or abscess without bleeding: Secondary | ICD-10-CM | POA: Insufficient documentation

## 2023-12-06 LAB — COMPREHENSIVE METABOLIC PANEL WITH GFR
ALT: 21 U/L (ref 0–44)
AST: 23 U/L (ref 15–41)
Albumin: 4 g/dL (ref 3.5–5.0)
Alkaline Phosphatase: 75 U/L (ref 38–126)
Anion gap: 11 (ref 5–15)
BUN: 12 mg/dL (ref 8–23)
CO2: 26 mmol/L (ref 22–32)
Calcium: 9.5 mg/dL (ref 8.9–10.3)
Chloride: 101 mmol/L (ref 98–111)
Creatinine, Ser: 0.85 mg/dL (ref 0.44–1.00)
GFR, Estimated: >60 mL/min (ref >60)
Glucose, Bld: 126 mg/dL — ABNORMAL HIGH (ref 70–99)
Potassium: 3.4 mmol/L — ABNORMAL LOW (ref 3.5–5.1)
Sodium: 138 mmol/L (ref 135–145)
Total Bilirubin: 0.7 mg/dL (ref 0.0–1.2)
Total Protein: 7.9 g/dL (ref 6.5–8.1)

## 2023-12-06 LAB — CBC
HCT: 46.3 % — ABNORMAL HIGH (ref 36.0–46.0)
Hemoglobin: 15.4 g/dL — ABNORMAL HIGH (ref 12.0–15.0)
MCH: 29.6 pg (ref 26.0–34.0)
MCHC: 33.3 g/dL (ref 30.0–36.0)
MCV: 88.9 fL (ref 80.0–100.0)
Platelets: 354 10*3/uL (ref 150–400)
RBC: 5.21 MIL/uL — ABNORMAL HIGH (ref 3.87–5.11)
RDW: 12.4 % (ref 11.5–15.5)
WBC: 14.4 10*3/uL — ABNORMAL HIGH (ref 4.0–10.5)
nRBC: 0 % (ref 0.0–0.2)

## 2023-12-06 LAB — LIPASE, BLOOD: Lipase: 25 U/L (ref 11–51)

## 2023-12-06 NOTE — ED Notes (Signed)
 Patient stated she was in pain and needed to take her home medication triage nurse made aware

## 2023-12-06 NOTE — ED Notes (Signed)
 Patient stated she was in pain and need to take her home medication. Triage nurse made aware

## 2023-12-06 NOTE — ED Triage Notes (Addendum)
 Pt reports lower mid abdominal pain that has been ongoing for the past 1.5 months. Work up has been reassuring when seen by her urologist. Sts concern for bleeding - bright red uncertain if its rectal or from her urethra. Sts pain in her rectum that has been intermittent - concerned as pain has been persistent today.

## 2023-12-07 ENCOUNTER — Emergency Department (HOSPITAL_COMMUNITY)

## 2023-12-07 DIAGNOSIS — N950 Postmenopausal bleeding: Secondary | ICD-10-CM | POA: Diagnosis not present

## 2023-12-07 LAB — URINALYSIS, ROUTINE W REFLEX MICROSCOPIC
Bilirubin Urine: NEGATIVE
Glucose, UA: NEGATIVE mg/dL
Ketones, ur: NEGATIVE mg/dL
Nitrite: NEGATIVE
Protein, ur: 30 mg/dL — AB
Specific Gravity, Urine: 1.005 (ref 1.005–1.030)
pH: 5 (ref 5.0–8.0)

## 2023-12-07 MED ORDER — SODIUM CHLORIDE (PF) 0.9 % IJ SOLN
INTRAMUSCULAR | Status: AC
  Filled 2023-12-07: qty 50

## 2023-12-07 MED ORDER — CIPROFLOXACIN HCL 500 MG PO TABS: 500.0000 mg | ORAL_TABLET | Freq: Two times a day (BID) | ORAL | 0 refills | Status: AC

## 2023-12-07 MED ORDER — CIPROFLOXACIN HCL 500 MG PO TABS
500.0000 mg | ORAL_TABLET | Freq: Once | ORAL | Status: AC
Start: 2023-12-07 — End: 2023-12-07
  Administered 2023-12-07: 500 mg via ORAL
  Filled 2023-12-07: qty 1

## 2023-12-07 MED ORDER — SODIUM CHLORIDE 0.9 % IV BOLUS
1000.0000 mL | Freq: Once | INTRAVENOUS | Status: AC
  Administered 2023-12-07: 1000 mL via INTRAVENOUS

## 2023-12-07 MED ORDER — IOHEXOL 300 MG/ML  SOLN
100.0000 mL | Freq: Once | INTRAMUSCULAR | Status: AC | PRN
  Administered 2023-12-07: 100 mL via INTRAVENOUS

## 2023-12-07 MED ORDER — METRONIDAZOLE 500 MG PO TABS: 500.0000 mg | ORAL_TABLET | Freq: Three times a day (TID) | ORAL | 0 refills | Status: AC

## 2023-12-07 MED ORDER — METRONIDAZOLE 500 MG PO TABS
500.0000 mg | ORAL_TABLET | Freq: Once | ORAL | Status: AC
  Administered 2023-12-07: 500 mg via ORAL
  Filled 2023-12-07: qty 1

## 2023-12-07 NOTE — ED Provider Notes (Signed)
 [place] EMERGENCY DEPARTMENT AT [facility] Provider Note   CSN: [id number] Arrival date & time: 12/06/23  2039     History  Chief Complaint  Patient presents with   Abdominal Pain    Donna Russell is a 68 y.o. female.  The history is provided by the patient.  Abdominal Pain Donna Russell is a 68 y.o. female who presents to the Emergency Department complaining of abdominal pain and diarrhea.  She presents to the emergency department accompanied by her cousin for evaluation of intermittent abdominal pain that started 1-1/2 months ago.  She has been treated with Cipro, followed by amoxicillin for recurrent UTIs.  She does report decreased oral intake.  No fever, no vomiting.  Diarrhea is nonbloody.  She does not have any dysuria.  She has been experiencing intermittent vaginal bleeding for the last month and a half that is described as spotting.  Today she developed increased bleeding.  She has used 2 pads throughout the course of the day.  No blood clots.  Her urine is clear.     Home Medications Prior to Admission medications   Medication Sig Start Date End Date Taking? Authorizing Provider  ciprofloxacin (CIPRO) 500 MG tablet Take 1 tablet (500 mg total) by mouth every 12 (twelve) hours. 12/07/23  Yes Tilden Fossa, MD  metroNIDAZOLE (FLAGYL) 500 MG tablet Take 1 tablet (500 mg total) by mouth 3 (three) times daily. 12/07/23  Yes Tilden Fossa, MD  atorvastatin (LIPITOR) 10 MG tablet Take 1 tablet (10 mg total) by mouth daily. 10/22/21   Kuneff, Renee A, DO  blood glucose meter kit and supplies Dispense based on patient and insurance preference. Use up to four times daily as directed. (FOR ICD-10 E10.9, E11.9). 06/25/22   Kuneff, Renee A, DO  Coenzyme Q10 (CO Q 10 PO) Take 1 tablet by mouth daily.    [provider]  Cyanocobalamin (VITAMIN B-12 PO) Take by mouth.    [provider]  glipiZIDE (GLUCOTROL) 5 MG tablet Take 0.5 tablets (2.5 mg  total) by mouth daily before breakfast. 06/24/22   Kuneff, Renee A, DO  hydrochlorothiazide (HYDRODIURIL) 12.5 MG tablet Take 1 tablet (12.5 mg total) by mouth daily. 06/24/22   Kuneff, Renee A, DO  lisinopril (ZESTRIL) 20 MG tablet Take 1 tablet (20 mg total) by mouth daily. 06/24/22   Kuneff, Renee A, DO  metFORMIN (GLUCOPHAGE) 500 MG tablet Take 1 tablet (500 mg total) by mouth daily with breakfast. 06/24/22   Kuneff, Renee A, DO  Multiple Vitamin (MULTIVITAMIN WITH MINERALS) TABS tablet Take 1 tablet by mouth daily.    [provider]  VITAMIN D PO Take by mouth.    [provider]      Allergies    Patient has no known allergies.    Review of Systems   Review of Systems  Gastrointestinal:  Positive for abdominal pain.  All other systems reviewed and are negative.   Physical Exam Updated Vital Signs BP 135/83 (BP Location: Right Arm)   Pulse 97   Temp 97.8 F (36.6 C) (Oral)   Resp 16   Ht 5\' 5"  (1.651 m)   Wt 102.1 kg   SpO2 95%   BMI 37.44 kg/m  Physical Exam Vitals and nursing note reviewed.  Constitutional:      Appearance: She is well-developed.  HENT:     Head: Normocephalic and atraumatic.  Cardiovascular:     Rate and Rhythm: Regular rhythm. Tachycardia present.  Heart sounds: No murmur heard. Pulmonary:     Effort: Pulmonary effort is normal. No respiratory distress.     Breath sounds: Normal breath sounds.  Abdominal:     Palpations: Abdomen is soft.     Tenderness: There is no guarding or rebound.     Comments: Mild generalized abdominal tenderness  Genitourinary:    Comments: There is bright red blood in the vaginal vault, blood is noted to be coming from the os without any clots.  There is a small, steady ooze. Musculoskeletal:        General: No tenderness.  Skin:    General: Skin is warm and dry.  Neurological:     Mental Status: She is alert and oriented to person, place, and time.  Psychiatric:        Behavior: Behavior  normal.     ED Results / Procedures / Treatments   Labs (all labs ordered are listed, but only abnormal results are displayed) Labs Reviewed  COMPREHENSIVE METABOLIC PANEL WITH GFR - Abnormal; Notable for the following components:      Result Value   Potassium 3.4 (*)    Glucose, Bld 126 (*)    All other components within normal limits  CBC - Abnormal; Notable for the following components:   WBC 14.4 (*)    RBC 5.21 (*)    Hemoglobin 15.4 (*)    HCT 46.3 (*)    All other components within normal limits  URINALYSIS, ROUTINE W REFLEX MICROSCOPIC - Abnormal; Notable for the following components:   APPearance HAZY (*)    Hgb urine dipstick MODERATE (*)    Protein, ur 30 (*)    Leukocytes,Ua MODERATE (*)    Bacteria, UA RARE (*)    All other components within normal limits  URINE CULTURE  LIPASE, BLOOD    EKG None  Radiology CT ABDOMEN PELVIS W CONTRAST Result Date: 12/07/2023 CLINICAL DATA:  Lower abdominal pain, nonlocalized, with diarrhea, ongoing x 1.5 months. Discharging bright red blood but unsure if from the vagina, urethra or rectum. EXAM: CT ABDOMEN AND PELVIS WITH CONTRAST TECHNIQUE: Multidetector CT imaging of the abdomen and pelvis was performed using the standard protocol following bolus administration of intravenous contrast. RADIATION DOSE REDUCTION: This exam was performed according to the departmental dose-optimization program which includes automated exposure control, adjustment of the mA and/or kV according to patient size and/or use of iterative reconstruction technique. CONTRAST:  100mL OMNIPAQUE IOHEXOL 300 MG/ML  SOLN COMPARISON:  CT with IV and oral contrast 06/18/2015. FINDINGS: Lower chest: There is linear scarring or atelectasis in the posterior lower lobes. The lung bases otherwise clear. Cardiac size is normal. Hepatobiliary: No focal liver abnormality is seen. The liver is 18 cm length and mildly steatotic. No gallstones, gallbladder wall thickening, or  biliary dilatation. There is a 9 mm gallbladder wall polyp again noted in the distal fundus, which seems unchanged since 2016. Pancreas: There is fatty infiltration, greatest in head, uncinate process and sections. No mass, ductal dilatation or inflammatory change. Spleen: Normal. Adrenals/Urinary Tract: No adrenal hemorrhage or renal injury identified. Bladder is unremarkable. There is evidence of pelvic floor laxity without evidence of cystocele. Stomach/Bowel: Unremarkable contracted stomach. Normal caliber unopacified small bowel. Interval appendectomy. There is diverticulosis of the descending and sigmoid colon. Slight increased haziness is seen around junction of the descending and sigmoid colon concerning for mild acute diverticulitis, without evidence of free air or abscess. There is underlying wall thickening. Follow-up colonoscopy recommended when clinically  feasible. Vascular/Lymphatic: Minimal aortic atherosclerosis. No adenopathy is seen no AAA. Reproductive: The endometrial cavity is distended with fluid and heterogeneous material, much of which is probably clotted blood. There is nodular thickening of the endometrial layer concerning for neoplasm. The ovaries are not enlarged. Other: Small right inguinal fat hernia. No incarcerated hernia. No free fluid, free hemorrhage or free air. Musculoskeletal: Facet hypertrophy lower lumbar spine greatest at L4-5. Lumbar spondylosis. No acute or other significant osseous findings. IMPRESSION: 1. Findings concerning for mild acute diverticulitis at the junction of the descending and sigmoid colon, without evidence of free air or abscess. Follow-up colonoscopy recommended when clinically feasible to exclude underlying lesion fall. 2. Distended endometrial cavity with fluid and heterogeneous material, much of which is probably clotted blood. There is nodular thickening of the endometrial layer concerning for neoplasm. GYN follow-up recommended. 3. Pelvic floor  laxity without evidence of cystocele. 4. Minimal aortic atherosclerosis. 5. 9 mm gallbladder wall polyp, unchanged since 2016. 6. Mild hepatic steatosis. 7. Small right inguinal fat hernia. Electronically Signed   By: Almira Bar M.D.   On: 12/07/2023 06:17   US Pelvis Complete Result Date: 12/07/2023 CLINICAL DATA:  Vaginal bleeding for 1 day EXAM: TRANSABDOMINAL AND TRANSVAGINAL ULTRASOUND OF PELVIS TECHNIQUE: Both transabdominal and transvaginal ultrasound examinations of the pelvis were performed. Transabdominal technique was performed for global imaging of the pelvis including uterus, ovaries, adnexal regions, and pelvic cul-de-sac. It was necessary to proceed with endovaginal exam following the transabdominal exam to visualize the ovaries. COMPARISON:  None Available. FINDINGS: Uterus Measurements: 10.1 x 5.6 x 7.2 cm. = volume: 213 mL. No fibroids or other mass visualized. Endometrium Thickness: 35 mm.  Slight increased vascularity is noted. Right ovary Not well visualized Left ovary Not well visualized Other findings No abnormal free fluid. IMPRESSION: Nonvisualization of the ovaries. Prominent endometrium with heterogeneity at 35 mm. In the setting of post-menopausal bleeding, endometrial sampling is indicated to exclude carcinoma. If results are benign, sonohysterogram should be considered for focal lesion work-up. (Ref: Radiological Reasoning: Algorithmic Workup of Abnormal Vaginal Bleeding with Endovaginal Sonography and Sonohysterography. AJR 2008; 161:W96-04) Electronically Signed   By: Alcide Clever M.D.   On: 12/07/2023 02:33   US Transvaginal Non-OB Result Date: 12/07/2023 CLINICAL DATA:  Vaginal bleeding for 1 day EXAM: TRANSABDOMINAL AND TRANSVAGINAL ULTRASOUND OF PELVIS TECHNIQUE: Both transabdominal and transvaginal ultrasound examinations of the pelvis were performed. Transabdominal technique was performed for global imaging of the pelvis including uterus, ovaries, adnexal regions, and  pelvic cul-de-sac. It was necessary to proceed with endovaginal exam following the transabdominal exam to visualize the ovaries. COMPARISON:  None Available. FINDINGS: Uterus Measurements: 10.1 x 5.6 x 7.2 cm. = volume: 213 mL. No fibroids or other mass visualized. Endometrium Thickness: 35 mm.  Slight increased vascularity is noted. Right ovary Not well visualized Left ovary Not well visualized Other findings No abnormal free fluid. IMPRESSION: Nonvisualization of the ovaries. Prominent endometrium with heterogeneity at 35 mm. In the setting of post-menopausal bleeding, endometrial sampling is indicated to exclude carcinoma. If results are benign, sonohysterogram should be considered for focal lesion work-up. (Ref: Radiological Reasoning: Algorithmic Workup of Abnormal Vaginal Bleeding with Endovaginal Sonography and Sonohysterography. AJR 2008; 540:J81-19) Electronically Signed   By: Alcide Clever M.D.   On: 12/07/2023 02:33    Procedures Procedures    Medications Ordered in ED Medications  sodium chloride 0.9 % bolus 1,000 mL (0 mLs Intravenous Stopped 12/07/23 0306)  iohexol (OMNIPAQUE) 300 MG/ML solution 100 mL (100  mLs Intravenous Contrast Given 12/07/23 0536)  ciprofloxacin (CIPRO) tablet 500 mg (500 mg Oral Given 12/07/23 0700)  metroNIDAZOLE (FLAGYL) tablet 500 mg (500 mg Oral Given 12/07/23 0700)    ED Course/ Medical Decision Making/ A&P                                 Medical Decision Making Amount and/or Complexity of Data Reviewed Labs: ordered. Radiology: ordered.  Risk Prescription drug management.   Pt here for evaluation of lower abdominal pain, vaginal bleeding and diarrhea.  Pt with presenting tachycardia, improved during ED stay.  Abdominal exam with mild tenderness.  CBC with leukocytosis.  UA not c/w UTI.  Pelvic with small amount of bleeding from cervix, no hemorrhage or clot in os.  Pelvic us  with thickened endometrial stripe.  CT scan was obtained, which is concerning  for diverticulitis as well as possible endometrial ca.  D/w pt and cousin at bedside findings of studies.  Her pain is currently controlled and she has no vomiting.  Feel she is stable for discharge on oral abx re diverticulitis as well as gyn follow up for vaginal bleeding/endometrial changes.  Discussed importance of prompt outpatient follow up for further eval.          Final Clinical Impression(s) / ED Diagnoses Final diagnoses:  Post-menopausal bleeding  Acute diverticulitis    Rx / DC Orders ED Discharge Orders          Ordered    ciprofloxacin (CIPRO) 500 MG tablet  Every 12 hours        12/07/23 0628    metroNIDAZOLE (FLAGYL) 500 MG tablet  3 times daily        12/07/23 1610              Kelsey Patricia, MD 12/07/23 [phone]

## 2023-12-07 NOTE — ED Notes (Signed)
 Called to Lab to ask to add on urine culture to urine that had previously been sent down to lab, lab said that was okay.

## 2023-12-08 LAB — URINE CULTURE: Culture: NO GROWTH

## 2024-03-28 ENCOUNTER — Other Ambulatory Visit (HOSPITAL_COMMUNITY): Payer: Self-pay

## 2024-03-28 DIAGNOSIS — C541 Malignant neoplasm of endometrium: Secondary | ICD-10-CM

## 2024-05-16 ENCOUNTER — Telehealth: Payer: Self-pay

## 2024-05-16 NOTE — Transitions of Care (Post Inpatient/ED Visit) (Signed)
   05/16/2024  Name: Donna Russell MRN: [id number] DOB: 20-Nov-1955  Today's TOC FU Call Status: Today's TOC FU Call Status:: Unsuccessful Call (1st Attempt) Unsuccessful Call (1st Attempt) Date: 05/16/24  Attempted to reach the patient regarding the most recent Inpatient/ED visit.  Follow Up Plan: Additional outreach attempts will be made to reach the patient to complete the Transitions of Care (Post Inpatient/ED visit) call.   Medford Balboa, BSN, RN [place]  [facility] - [employer] [facility] RN Care Manager [phone]
